# Patient Record
Sex: Female | Born: 1950
Health system: Southern US, Community
[De-identification: ages and names within clinical notes are randomized; demographics above are authoritative.]

## PROBLEM LIST (undated history)

## (undated) DIAGNOSIS — E785 Hyperlipidemia, unspecified: Secondary | ICD-10-CM

## (undated) DIAGNOSIS — R05 Cough: Secondary | ICD-10-CM

## (undated) DIAGNOSIS — K219 Gastro-esophageal reflux disease without esophagitis: Secondary | ICD-10-CM

## (undated) DIAGNOSIS — F329 Major depressive disorder, single episode, unspecified: Secondary | ICD-10-CM

## (undated) DIAGNOSIS — R059 Cough, unspecified: Secondary | ICD-10-CM

## (undated) DIAGNOSIS — F32A Depression, unspecified: Secondary | ICD-10-CM

## (undated) DIAGNOSIS — N289 Disorder of kidney and ureter, unspecified: Secondary | ICD-10-CM

## (undated) DIAGNOSIS — Z460 Encounter for fitting and adjustment of spectacles and contact lenses: Secondary | ICD-10-CM

## (undated) DIAGNOSIS — I10 Essential (primary) hypertension: Secondary | ICD-10-CM

## (undated) DIAGNOSIS — Z8489 Family history of other specified conditions: Secondary | ICD-10-CM

## (undated) DIAGNOSIS — M75101 Unspecified rotator cuff tear or rupture of right shoulder, not specified as traumatic: Secondary | ICD-10-CM

## (undated) DIAGNOSIS — M1990 Unspecified osteoarthritis, unspecified site: Secondary | ICD-10-CM

## (undated) HISTORY — DX: Hyperlipidemia, unspecified: E78.5

## (undated) HISTORY — PX: COLONOSCOPY: SHX174

## (undated) HISTORY — DX: Essential (primary) hypertension: I10

## (undated) HISTORY — PX: TONSILLECTOMY: SUR1361

## (undated) HISTORY — DX: Gastro-esophageal reflux disease without esophagitis: K21.9

## (undated) HISTORY — DX: Unspecified osteoarthritis, unspecified site: M19.90

## (undated) HISTORY — PX: ELBOW ARTHROSCOPY: SUR87

---

## 1985-12-22 HISTORY — PX: CARPAL TUNNEL RELEASE: SHX101

## 1987-12-23 HISTORY — PX: TUBAL LIGATION: SHX77

## 1995-12-23 HISTORY — PX: ROTATOR CUFF REPAIR: SHX139

## 1996-12-22 HISTORY — PX: BACK SURGERY: SHX140

## 1999-05-28 ENCOUNTER — Other Ambulatory Visit: Admission: RE | Admit: 1999-05-28 | Discharge: 1999-05-28 | Payer: Self-pay | Admitting: Obstetrics and Gynecology

## 2000-06-28 ENCOUNTER — Emergency Department (HOSPITAL_COMMUNITY): Admission: EM | Admit: 2000-06-28 | Discharge: 2000-06-28 | Payer: Self-pay

## 2000-07-07 ENCOUNTER — Encounter: Payer: Self-pay | Admitting: Neurosurgery

## 2000-07-07 ENCOUNTER — Encounter: Admission: RE | Admit: 2000-07-07 | Discharge: 2000-07-07 | Payer: Self-pay | Admitting: Neurosurgery

## 2000-11-05 ENCOUNTER — Other Ambulatory Visit: Admission: RE | Admit: 2000-11-05 | Discharge: 2000-11-05 | Payer: Self-pay | Admitting: Obstetrics and Gynecology

## 2000-11-19 ENCOUNTER — Encounter: Admission: RE | Admit: 2000-11-19 | Discharge: 2000-11-19 | Payer: Self-pay | Admitting: Neurology

## 2000-11-19 ENCOUNTER — Encounter: Payer: Self-pay | Admitting: Neurology

## 2000-12-28 ENCOUNTER — Other Ambulatory Visit: Admission: RE | Admit: 2000-12-28 | Discharge: 2000-12-28 | Payer: Self-pay | Admitting: Obstetrics and Gynecology

## 2000-12-28 ENCOUNTER — Encounter (INDEPENDENT_AMBULATORY_CARE_PROVIDER_SITE_OTHER): Payer: Self-pay

## 2001-01-22 ENCOUNTER — Other Ambulatory Visit: Admission: RE | Admit: 2001-01-22 | Discharge: 2001-01-22 | Payer: Self-pay | Admitting: Obstetrics and Gynecology

## 2001-01-22 ENCOUNTER — Encounter (INDEPENDENT_AMBULATORY_CARE_PROVIDER_SITE_OTHER): Payer: Self-pay | Admitting: Specialist

## 2001-11-22 ENCOUNTER — Other Ambulatory Visit: Admission: RE | Admit: 2001-11-22 | Discharge: 2001-11-22 | Payer: Self-pay | Admitting: Obstetrics and Gynecology

## 2003-03-22 ENCOUNTER — Other Ambulatory Visit: Admission: RE | Admit: 2003-03-22 | Discharge: 2003-03-22 | Payer: Self-pay | Admitting: Obstetrics and Gynecology

## 2003-04-26 ENCOUNTER — Encounter: Admission: RE | Admit: 2003-04-26 | Discharge: 2003-07-25 | Payer: Self-pay | Admitting: Obstetrics and Gynecology

## 2003-04-27 ENCOUNTER — Ambulatory Visit (HOSPITAL_COMMUNITY): Admission: RE | Admit: 2003-04-27 | Discharge: 2003-04-27 | Payer: Self-pay | Admitting: Gastroenterology

## 2003-12-23 HISTORY — PX: FOOT SURGERY: SHX648

## 2004-02-21 ENCOUNTER — Ambulatory Visit (HOSPITAL_COMMUNITY): Admission: RE | Admit: 2004-02-21 | Discharge: 2004-02-21 | Payer: Self-pay | Admitting: Family Medicine

## 2004-03-26 ENCOUNTER — Other Ambulatory Visit: Admission: RE | Admit: 2004-03-26 | Discharge: 2004-03-26 | Payer: Self-pay | Admitting: Obstetrics and Gynecology

## 2004-04-22 ENCOUNTER — Emergency Department (HOSPITAL_COMMUNITY): Admission: EM | Admit: 2004-04-22 | Discharge: 2004-04-22 | Payer: Self-pay | Admitting: Emergency Medicine

## 2004-12-11 ENCOUNTER — Ambulatory Visit: Payer: Self-pay | Admitting: Pain Medicine

## 2004-12-22 HISTORY — PX: DILATION AND CURETTAGE OF UTERUS: SHX78

## 2004-12-26 ENCOUNTER — Ambulatory Visit: Payer: Self-pay | Admitting: Pain Medicine

## 2005-01-23 ENCOUNTER — Ambulatory Visit: Payer: Self-pay | Admitting: Physician Assistant

## 2005-02-19 ENCOUNTER — Ambulatory Visit: Payer: Self-pay | Admitting: Physician Assistant

## 2005-05-20 ENCOUNTER — Other Ambulatory Visit: Admission: RE | Admit: 2005-05-20 | Discharge: 2005-05-20 | Payer: Self-pay | Admitting: Obstetrics and Gynecology

## 2005-10-13 ENCOUNTER — Ambulatory Visit: Payer: Self-pay | Admitting: Pain Medicine

## 2005-10-28 ENCOUNTER — Ambulatory Visit: Payer: Self-pay | Admitting: Pain Medicine

## 2005-10-31 ENCOUNTER — Encounter (INDEPENDENT_AMBULATORY_CARE_PROVIDER_SITE_OTHER): Payer: Self-pay | Admitting: Specialist

## 2005-10-31 ENCOUNTER — Ambulatory Visit (HOSPITAL_COMMUNITY): Admission: RE | Admit: 2005-10-31 | Discharge: 2005-10-31 | Payer: Self-pay | Admitting: Obstetrics and Gynecology

## 2005-12-01 ENCOUNTER — Ambulatory Visit: Payer: Self-pay | Admitting: Pain Medicine

## 2006-10-28 ENCOUNTER — Ambulatory Visit: Payer: Self-pay | Admitting: Pain Medicine

## 2006-12-22 HISTORY — PX: BACK SURGERY: SHX140

## 2010-03-29 ENCOUNTER — Encounter: Payer: Self-pay | Admitting: Cardiovascular Disease

## 2010-04-15 ENCOUNTER — Ambulatory Visit (HOSPITAL_COMMUNITY): Admission: RE | Admit: 2010-04-15 | Discharge: 2010-04-15 | Payer: Self-pay | Admitting: Obstetrics and Gynecology

## 2011-01-11 ENCOUNTER — Encounter: Payer: Self-pay | Admitting: Family Medicine

## 2011-03-11 LAB — BASIC METABOLIC PANEL
BUN: 24 mg/dL — ABNORMAL HIGH (ref 6–23)
CO2: 30 mEq/L (ref 19–32)
Calcium: 9.5 mg/dL (ref 8.4–10.5)
Chloride: 102 mEq/L (ref 96–112)
Creatinine, Ser: 1.39 mg/dL — ABNORMAL HIGH (ref 0.4–1.2)
GFR calc Af Amer: 47 mL/min — ABNORMAL LOW (ref >60)
GFR calc non Af Amer: 39 mL/min — ABNORMAL LOW (ref >60)
Glucose, Bld: 111 mg/dL — ABNORMAL HIGH (ref 70–99)
Potassium: 3.9 mEq/L (ref 3.5–5.1)
Sodium: 138 mEq/L (ref 135–145)

## 2011-03-11 LAB — CBC
HCT: 39.5 % (ref 36.0–46.0)
Hemoglobin: 13.6 g/dL (ref 12.0–15.0)
MCHC: 34.5 g/dL (ref 30.0–36.0)
Platelets: 220 10*3/uL (ref 150–400)
RDW: 12.1 % (ref 11.5–15.5)

## 2011-05-09 NOTE — Op Note (Signed)
NAME:  Briana Wolf, Briana Wolf              ACCOUNT NO.:  [id number]   MEDICAL RECORD NO.:  [id number]          PATIENT TYPE:  AMB   LOCATION:  SDC                           FACILITY:  [facility]   PHYSICIAN:  Juluis Mire, M.D.   DATE OF BIRTH:  1951-01-30   DATE OF PROCEDURE:  10/31/2005  DATE OF DISCHARGE:                                 OPERATIVE REPORT   PREOPERATIVE DIAGNOSIS:  Postmenopausal bleeding.   POSTOPERATIVE DIAGNOSIS:  Postmenopausal bleeding.   OPERATION/PROCEDURE:  1.  Cervical dilatation.  2.  Hysteroscopy.  3.  Endometrial biopsies.  4.  Endometrial curettings.   SURGEON:  Juluis Mire, M.D.   ANESTHESIA:  General.   ESTIMATED BLOOD LOSS:  Minimal.   PACKS AND DRAINS:  None.   INTRAOPERATIVE BLOOD REPLACED:  None.   COMPLICATIONS:  None.   INDICATIONS:  Dictated in history and physical.   DESCRIPTION OF PROCEDURE:  The patient was taken to the OR, placed in the  supine position.  After adequate level of general anesthesia obtained, the  patient was placed in the dorsal lithotomy position using the Allen  stirrups.  The perineum and vagina were prepped out with Betadine and draped  in the sterile field.  Speculum was placed in the vaginal vault and cervix  grasped with single-tooth tenaculum.  Paracervical block was instituted  using 1% Nesacaine.  Uterus sounded to 10 cm.  Cervix serially dilated to a  size 35 Pratt dilator.  Operative hysteroscope was introduced.  Endometrium  was thin and atrophic.  Multiple biopsies were obtained along with  curettings.  No signs  of perforation.  Total deficit was 80 mL.  Single-tooth tenaculum and  speculum were then removed.  The patient was taken out of the dorsal  lithotomy position and once alert was transferred to the recovery room in  good condition.  Sponge, instrument, and needle counts reported as correct  by the circulating nurse x2.      Juluis Mire, M.D.  Electronically Signed     JSM/MEDQ  D:   10/31/2005  T:  10/31/2005  Job:  [id number]

## 2011-05-09 NOTE — H&P (Signed)
NAME:  Briana Wolf, Briana Wolf              ACCOUNT NO.:  [id number]   MEDICAL RECORD NO.:  [id number]          PATIENT TYPE:  AMB   LOCATION:  SDC                           [facility]ILITY:  [facility]   PHYSICIAN:  Juluis Mire, M.D.   DATE OF BIRTH:  12/11/1951   DATE OF ADMISSION:  10/31/2005  DATE OF DISCHARGE:                                HISTORY & PHYSICAL   HISTORY OF PRESENT ILLNESS:  The patient is a 60 year old gravida 5, para 2,  abortus 3, postmenopausal female who presents for a hysteroscopy.   The patient has had problems with postmenopausal bleeding.  Had a previous  saline infusion ultrasound and endometrial sampling in March of this year  with negative findings.  Has had continued bleeding that has been relatively  heavy.  After discussions of options, we decided to proceed with repeat  hysteroscopy.   It is of note that the patient has had a previous hysteroscopy in 1992 and  1995, basically with negative findings.   ALLERGIES:  In terms of allergies, she is allergic CODEINE.   MEDICATIONS:  1.  Nexium.  2.  Wellbutrin.  3.  Skelaxin.  4.  A fluid pill.   PAST MEDICAL HISTORY:  Usual childhood diseases.  No significant sequelae.   PAST SURGICAL HISTORY:  1.  She has had a previous history of carpal tunnel that was released.  2.  She has had a tonsillectomy.  3.  Bilateral tubal ligation.  4.  Laparoscopy and hysteroscopy in 1992.  5.  Hysteroscopy in 1995.   FAMILY HISTORY:  Noncontributory.   SOCIAL HISTORY:  No tobacco or alcohol use.   REVIEW OF SYSTEMS:  Noncontributory.   PHYSICAL EXAMINATION:  VITAL SIGNS:  The patient is afebrile with stable  vital signs.  HEENT:  The patient is normocephalic.  Pupils equal, round and reactive to  light and accommodation.  Extraocular movements were intact.  Sclerae and  conjunctivae clear.  Oropharynx clear.  NECK:  Without thyromegaly.  BREASTS:  No discrete masses.  LUNGS:  Clear.  CARDIOVASCULAR:  Regular rate and  rhythm without murmurs or gallops.  ABDOMEN:  Benign.  No masses, organomegaly, or tenderness.  PELVIC:  Normal external genitalia.  Vaginal mucosa clear.  Cervix  unremarkable.  Uterus normal size, shape, and contour.  Adnexa free of  masses or tenderness.  EXTREMITIES:  Trace edema.  NEUROLOGIC:  Grossly within normal limits.   IMPRESSION:  Postmenopausal bleeding without endometrial pathology.   PLAN:  The patient will undergo a repeat hysteroscopy.  Risks of surgery  have been discussed including the risks of infection, the risks of vascular  injury that could lead to hemorrhage requiring transfusion, or possible  hysterectomy.  Risks of perforation that could lead to injury to adjacent  organs requiring exploratory surgery, risks of deep vein thrombosis and  pulmonary embolus.  The patient expressed understanding of the indications  and risks.      Juluis Mire, M.D.  Electronically Signed     JSM/MEDQ  D:  10/31/2005  T:  10/31/2005  Job:  [id number]

## 2011-05-09 NOTE — Op Note (Signed)
   NAME:  Briana Wolf, Briana Wolf                          ACCOUNT NO.:  [id number]   MEDICAL RECORD NO.:  [id number]                   PATIENT TYPE:  AMB   LOCATION:  ENDO                                 FACILITY:  [facility]   PHYSICIAN:  Danise Edge, M.D.                DATE OF BIRTH:  03-25-1951   DATE OF PROCEDURE:  DATE OF DISCHARGE:                                 OPERATIVE REPORT   PROCEDURE:  Screening colonoscopy.   INDICATIONS:  The patient is a 60 year old female born October 19, 2051.  The patient  is scheduled to undergo her first screening colonoscopy with polypectomy to  prevent colon cancer.   ENDOSCOPIST:  Danise Edge, M.D.   PREMEDICATION:  Versed 7.5 mg, Demerol 50 mg.   DESCRIPTION OF PROCEDURE:  After obtaining informed consent, the patient was  placed in the left lateral decubitus position.  I administered intravenous  Demerol and intravenous Versed to achieve conscious sedation for the  procedure.  The patient's blood pressure, oxygen saturation, and cardiac  rhythm were monitored throughout the procedure and documented in the medical  record.   Anal inspection was normal.  Digital rectal exam was normal.  The Olympus  pediatric video colonoscope was introduced into the rectum and easily  advanced to the cecum.  Colonic preparation for the exam today was  excellent.   Rectum normal.   Sigmoid colon and descending colon normal.   Splenic flexure normal.   Transverse colon normal.   Hepatic flexure normal.   Ascending colon normal.   Cecum and ileocecal valve normal.    ASSESSMENT:  Normal screening proctocolonoscopy to the cecum.  No endoscopic  evidence for the presence of colorectal neoplasia.                                               Danise Edge, M.D.    MJ/MEDQ  D:  04/27/2003  T:  04/27/2003  Job:  [id number]   cc:   Caryn Bee L. Little, M.D.  [address]  [place]  [place] [address]  Fax: [phone]   Juluis Mire, M.D.  [address] [place]  [place] [address]  Fax: [phone]

## 2011-09-03 ENCOUNTER — Ambulatory Visit (INDEPENDENT_AMBULATORY_CARE_PROVIDER_SITE_OTHER): Payer: 59 | Admitting: General Surgery

## 2011-09-10 ENCOUNTER — Encounter (INDEPENDENT_AMBULATORY_CARE_PROVIDER_SITE_OTHER): Payer: Self-pay | Admitting: General Surgery

## 2011-09-10 ENCOUNTER — Ambulatory Visit (INDEPENDENT_AMBULATORY_CARE_PROVIDER_SITE_OTHER): Payer: BC Managed Care – PPO | Admitting: General Surgery

## 2011-09-10 VITALS — BP 134/78 | HR 72 | Temp 96.4°F | Resp 14 | Ht 62.0 in | Wt 155.2 lb

## 2011-09-10 DIAGNOSIS — M6749 Ganglion, multiple sites: Secondary | ICD-10-CM

## 2011-09-10 DIAGNOSIS — M7130 Other bursal cyst, unspecified site: Secondary | ICD-10-CM

## 2011-09-10 NOTE — Progress Notes (Signed)
Subjective:     Patient ID: Briana Wolf, female   DOB: Jun 28, 1951, 60 y.o.   MRN: [id number]  HPIMrs. Acres is a 60 year old Caucasian female who was referred to Dr. Caryn Bee for possibly a lipoma in the left lower back. The patient says that she has a little area that can intermittently be seen these pointed to the right side Dr. Clarene Duke is not it says left on examination I find no evidence of obvious subcutaneous mass in this area at this time I would question whether this could be a little bursal she's had a previous back surgery and has a midline incision slightly lower than the area of question. There is also a small insect bites she says the [place] that occurred a week or 2 ago but she's not able to feel a mass at this time  Dr. Fredirick Maudlin note dated August 30 at a small area he did not put a measurement possibly 4-5 cm but there is obviously nothing of that size on exam today and if she is having problems with her back she says she does continue to have a disc  pain in her back, her muscle would have spasm you might  be able to feel something  at that time.   Review of Systems Past Surgical History  Procedure Date  . Back surgery 2008    cervical fusion   . Back surgery   . Rotator cuff repair 1997    let shoulder   . Carpal tunnel release 1987    left and right wrist   . Foot surgery 2005    bilateral    Current Outpatient Prescriptions  Medication Sig Dispense Refill  . Atorvastatin Calcium (LIPITOR PO) Take by mouth daily.        . BuPROPion HCl (WELLBUTRIN PO) Take 450 mg by mouth daily.        Marland Kitchen Dexlansoprazole (DEXILANT PO) Take by mouth daily.        . fish oil-omega-3 fatty acids 1000 MG capsule Take 2 g by mouth daily.        . MELOXICAM PO Take 10 mg by mouth daily.         Allergies  Allergen Reactions  . Codeine     Makes pt nauseous    No history of lipomas that she is aware of     Objective:   Physical ExamBP 134/78  Pulse 72  Temp 96.4 F (35.8 C)   Resp 14  Ht 5\' 2"  (1.575 m)  Wt 155 lb 4 oz (70.421 kg)  BMI 28.40 kg/m2The patient was examined predominantly to her back and I could feel nothing with her lying flat did get her up r flex and twist in it and she is pointing to the right side per Dr. Lissa Hoard noted definitely describes a left. The patient is not able to appreciate anything on either side today and I cannot feel anything either  What they're describe and is probably a lipoma I think it's much more likely to be even a small bursal of the hands of the lumbar vertebrae possibly have been back spasms since she still has a sort lumbar disc intermittently gives problems with pain      Assessment:       I gave her the little hand data lipomas and if she is able to put an abnormality on happy to see her again urine she's not having much pain and with more to be positive there is soft and  is definitely given discomfort or any recommendation or possible excision is considered Plan:     See prn

## 2011-09-10 NOTE — Patient Instructions (Signed)
Happy to see her again if there is any mass noted the please have her husband outlined the area of question since she cannot see the area easily

## 2012-05-04 ENCOUNTER — Other Ambulatory Visit: Payer: Self-pay | Admitting: Family Medicine

## 2012-05-04 DIAGNOSIS — R799 Abnormal finding of blood chemistry, unspecified: Secondary | ICD-10-CM

## 2012-05-05 ENCOUNTER — Ambulatory Visit
Admission: RE | Admit: 2012-05-05 | Discharge: 2012-05-05 | Disposition: A | Discharge status: Home / Self Care | Payer: 59 | Source: Ambulatory Visit | Attending: Family Medicine | Admitting: Family Medicine

## 2012-05-05 DIAGNOSIS — R799 Abnormal finding of blood chemistry, unspecified: Secondary | ICD-10-CM

## 2012-07-14 ENCOUNTER — Ambulatory Visit (INDEPENDENT_AMBULATORY_CARE_PROVIDER_SITE_OTHER): Payer: 59 | Admitting: Cardiovascular Disease

## 2012-07-14 ENCOUNTER — Encounter: Payer: Self-pay | Admitting: Cardiovascular Disease

## 2012-07-14 VITALS — BP 115/85 | HR 76 | Ht 61.0 in | Wt 147.8 lb

## 2012-07-14 DIAGNOSIS — R0600 Dyspnea, unspecified: Secondary | ICD-10-CM | POA: Insufficient documentation

## 2012-07-14 DIAGNOSIS — R0989 Other specified symptoms and signs involving the circulatory and respiratory systems: Secondary | ICD-10-CM

## 2012-07-14 DIAGNOSIS — E785 Hyperlipidemia, unspecified: Secondary | ICD-10-CM

## 2012-07-14 NOTE — Assessment & Plan Note (Signed)
Briana Wolf presents for various problems including shortness breath with exertion and some palpitations but she's never had any episodes of chest pain. She has a very strong family history of cardiac disease and is very worried that she may have some problems.  She does not get any regular aerobic exercise. She just notices the shortness breath when she does her daily activities.  We'll schedule her for a stress echocardiogram.  Hepatic profile, and basic metabolic profile.

## 2012-07-14 NOTE — Patient Instructions (Addendum)
Your physician recommends that you schedule a follow-up appointment in: 3 months   Your physician has requested that you have a stress echocardiogram. Please follow instruction sheet as given.  Your physician recommends that you return for a FASTING lipid profile: 3 months

## 2012-07-14 NOTE — Progress Notes (Signed)
    Datra Clary [id number] Date of Birth  July 20, 1951       [facility] Office [address]  [address] [place], [place]  [address]   [place], [place]  [address] [phone]     [phone]   Fax  [phone]    Fax [phone]  Problem List: 1. Chest pain 2. Hyperlipidemia 3. CKD - stage 3  History of Present Illness:  Millicent is a 61 yo with a strong family history of CAD (father with MI, mother has a pacer, sister has coronary athersclerosis).  She has occasional CP ( upper chest in the throat area).   She has some dyspnea with exercise which is her main concern today.  She walks her dog  on occasion.  Current Outpatient Prescriptions on File Prior to Visit  Medication Sig Dispense Refill  . Atorvastatin Calcium (LIPITOR PO) Take by mouth daily.        . BuPROPion HCl (WELLBUTRIN PO) Take 450 mg by mouth daily.        Marland Kitchen Dexlansoprazole (DEXILANT PO) Take by mouth daily.        . fish oil-omega-3 fatty acids 1000 MG capsule Take 2 g by mouth daily.        . phentermine 15 MG capsule Take 15 mg by mouth every morning.        Allergies  Allergen Reactions  . Codeine     Makes pt nauseous     Past Medical History  Diagnosis Date  . Hyperlipidemia   . Hypertension   . GERD (gastroesophageal reflux disease)   . Arthritis     Past Surgical History  Procedure Date  . Back surgery 2008    cervical fusion   . Back surgery   . Rotator cuff repair 1997    let shoulder   . Carpal tunnel release 1987    left and right wrist   . Foot surgery 2005    bilateral     History  Smoking status  . Never Smoker   Smokeless tobacco  . Not on file    History  Alcohol Use  . Yes    Family History  Problem Relation Age of Onset  . Diabetes Mother   . Heart disease Father     Reviw of Systems:  Reviewed in the HPI.  All other systems are negative.  Physical Exam: Blood pressure 115/85, pulse 76, height 5\' 1"  (1.549 m),  weight 147 lb 12.8 oz (67.042 kg). General: Well developed, well nourished, in no acute distress.  Head: Normocephalic, atraumatic, sclera non-icteric, mucus membranes are moist,   Neck: Supple. Carotids are 2 + without bruits. No JVD  Lungs: Clear bilaterally to auscultation.  Heart: regular rate.  normal  S1 S2. No murmurs, gallops or rubs.  Abdomen: Soft, non-tender, non-distended with normal bowel sounds. No hepatomegaly. No rebound/guarding. No masses.  Msk:  Strength and tone are normal  Extremities: No clubbing or cyanosis. No edema.  Distal pedal pulses are 2+ and equal bilaterally.  Neuro: Alert and oriented X 3. Moves all extremities spontaneously.  Psych:  Responds to questions appropriately with a normal affect.  ECG: 07/14/2012 NSR at 76 bpm. No ST or T wave changes  Assessment / Plan:

## 2012-07-21 ENCOUNTER — Ambulatory Visit (HOSPITAL_COMMUNITY): Payer: 59 | Attending: Cardiovascular Disease

## 2012-07-21 ENCOUNTER — Encounter: Payer: Self-pay | Admitting: Cardiovascular Disease

## 2012-07-21 DIAGNOSIS — R0600 Dyspnea, unspecified: Secondary | ICD-10-CM

## 2012-07-21 DIAGNOSIS — R0989 Other specified symptoms and signs involving the circulatory and respiratory systems: Secondary | ICD-10-CM | POA: Insufficient documentation

## 2012-07-21 DIAGNOSIS — R072 Precordial pain: Secondary | ICD-10-CM

## 2012-07-21 DIAGNOSIS — R0609 Other forms of dyspnea: Secondary | ICD-10-CM | POA: Insufficient documentation

## 2012-07-21 NOTE — Progress Notes (Signed)
Echocardiogram performed.  

## 2012-09-28 ENCOUNTER — Other Ambulatory Visit (INDEPENDENT_AMBULATORY_CARE_PROVIDER_SITE_OTHER): Payer: 59

## 2012-09-28 DIAGNOSIS — R0600 Dyspnea, unspecified: Secondary | ICD-10-CM

## 2012-09-28 DIAGNOSIS — R0609 Other forms of dyspnea: Secondary | ICD-10-CM

## 2012-09-28 DIAGNOSIS — E785 Hyperlipidemia, unspecified: Secondary | ICD-10-CM

## 2012-09-28 LAB — BASIC METABOLIC PANEL
BUN: 25 mg/dL — ABNORMAL HIGH (ref 6–23)
Calcium: 9.2 mg/dL (ref 8.4–10.5)
GFR: 48.76 mL/min — ABNORMAL LOW (ref >60.00)
Glucose, Bld: 90 mg/dL (ref 70–99)

## 2012-09-28 LAB — LIPID PANEL
HDL: 51.8 mg/dL (ref >39.00)
Triglycerides: 113 mg/dL (ref 0.0–149.0)
VLDL: 22.6 mg/dL (ref 0.0–40.0)

## 2012-09-28 LAB — HEPATIC FUNCTION PANEL
Albumin: 3.8 g/dL (ref 3.5–5.2)
Bilirubin, Direct: 0.1 mg/dL (ref 0.0–0.3)
Total Protein: 7 g/dL (ref 6.0–8.3)

## 2012-09-28 LAB — LDL CHOLESTEROL, DIRECT: Direct LDL: 133.1 mg/dL

## 2012-09-29 ENCOUNTER — Encounter: Payer: Self-pay | Admitting: *Deleted

## 2012-10-05 ENCOUNTER — Ambulatory Visit: Payer: 59 | Admitting: Cardiovascular Disease

## 2012-10-05 ENCOUNTER — Ambulatory Visit (INDEPENDENT_AMBULATORY_CARE_PROVIDER_SITE_OTHER): Payer: 59 | Admitting: Cardiovascular Disease

## 2012-10-05 ENCOUNTER — Encounter: Payer: Self-pay | Admitting: Cardiovascular Disease

## 2012-10-05 VITALS — BP 125/75 | HR 74 | Ht 61.0 in | Wt 152.0 lb

## 2012-10-05 DIAGNOSIS — I10 Essential (primary) hypertension: Secondary | ICD-10-CM

## 2012-10-05 DIAGNOSIS — E785 Hyperlipidemia, unspecified: Secondary | ICD-10-CM

## 2012-10-05 NOTE — Assessment & Plan Note (Signed)
Briana Wolf's last LDL was 133. She was previously on atorvastatin but does not remember the dose.  I offered to start her atorvastatin on low-dose. She would rather follow up with her general medical Dr.  Diamantina Providence see her on an as-needed basis.

## 2012-10-05 NOTE — Patient Instructions (Addendum)
Your physician recommends that you schedule a follow-up appointment in: as needed basis, follow up with your primary care physician.   Restart your atorvastatin

## 2012-10-05 NOTE — Progress Notes (Signed)
Graclyn Lawther [id number] Date of Birth  [birth year]       [facility] Office [address]  [address] [place], [place]  [address]   [place], [place]  [address] [phone]     [phone]   Fax  [phone]    Fax [phone]  Problem List: 1. Chest pain 2. Hyperlipidemia 3. CKD - stage 3 4. Hypertension  History of Present Illness:  Cameran is a 61 yo with a strong family history of CAD (father with MI, mother has a pacer, sister has coronary athersclerosis).  She has occasional CP ( upper chest in the throat area).   She has some dyspnea with exercise which is her main concern today.  She walks her dog  on occasion.  She has not had any further episodes of dyspnea.  She was previously on atorvastatin but does not remember the dose. Atorvastatin was apparently discontinued when her liver enzymes were elevated.  Her most recent cholesterol level reveals an LDL of 133.  Current Outpatient Prescriptions on File Prior to Visit  Medication Sig Dispense Refill  . BuPROPion HCl (WELLBUTRIN PO) Take 450 mg by mouth daily.        Marland Kitchen Dexlansoprazole (DEXILANT PO) Take by mouth daily.        . fish oil-omega-3 fatty acids 1000 MG capsule Take 2 g by mouth daily.        . phentermine 15 MG capsule Take 15 mg by mouth every morning.      . triamterene-hydrochlorothiazide (MAXZIDE-25) 37.5-25 MG per tablet Take 1 tablet by mouth daily.       . Atorvastatin Calcium (LIPITOR PO) Take by mouth daily.          Allergies  Allergen Reactions  . Codeine     Makes pt nauseous     Past Medical History  Diagnosis Date  . Hyperlipidemia   . Hypertension   . GERD (gastroesophageal reflux disease)   . Arthritis     Past Surgical History  Procedure Date  . Back surgery 2008    cervical fusion   . Back surgery   . Rotator cuff repair 1997    let shoulder   . Carpal tunnel release 1987    left and right wrist   . Foot surgery 2005   bilateral     History  Smoking status  . Never Smoker   Smokeless tobacco  . Not on file    History  Alcohol Use  . Yes    Family History  Problem Relation Age of Onset  . Diabetes Mother   . Heart disease Father     Reviw of Systems:  Reviewed in the HPI.  All other systems are negative.  Physical Exam: Blood pressure 150/96, pulse 74, height 5\' 1"  (1.549 m), weight 152 lb (68.947 kg), SpO2 98.00%. General: Well developed, well nourished, in no acute distress.  Head: Normocephalic, atraumatic, sclera non-icteric, mucus membranes are moist,   Neck: Supple. Carotids are 2 + without bruits. No JVD  Lungs: Clear bilaterally to auscultation.  Heart: regular rate.  normal  S1 S2. No murmurs, gallops or rubs.  Abdomen: Soft, non-tender, non-distended with normal bowel sounds. No hepatomegaly. No rebound/guarding. No masses.  Msk:  Strength and tone are normal  Extremities: No clubbing or cyanosis. No edema.  Distal pedal pulses are 2+ and equal bilaterally.  Neuro: Alert and oriented X 3. Moves all extremities spontaneously.  Psych:  Responds to questions appropriately with a normal affect.  ECG: 10/05/2012 NSR at 76 bpm. No ST or T wave changes  Assessment / Plan:

## 2012-10-05 NOTE — Assessment & Plan Note (Signed)
Blood pressure was initially a little elevated but it returned to the normal range after several minutes. I've asked her to watch her salt intake. She will followup with her general medical doctor for further management of her hypertension.

## 2012-10-13 ENCOUNTER — Encounter: Payer: Self-pay | Admitting: Cardiovascular Disease

## 2012-12-07 ENCOUNTER — Emergency Department (HOSPITAL_COMMUNITY)
Admission: EM | Admit: 2012-12-07 | Discharge: 2012-12-08 | Disposition: A | Discharge status: Home / Self Care | Payer: 59 | Attending: Emergency Medicine | Admitting: Emergency Medicine

## 2012-12-07 ENCOUNTER — Encounter (HOSPITAL_COMMUNITY): Payer: Self-pay | Admitting: *Deleted

## 2012-12-07 DIAGNOSIS — R51 Headache: Secondary | ICD-10-CM

## 2012-12-07 DIAGNOSIS — Z87828 Personal history of other (healed) physical injury and trauma: Secondary | ICD-10-CM | POA: Insufficient documentation

## 2012-12-07 DIAGNOSIS — E785 Hyperlipidemia, unspecified: Secondary | ICD-10-CM | POA: Insufficient documentation

## 2012-12-07 DIAGNOSIS — Z79899 Other long term (current) drug therapy: Secondary | ICD-10-CM | POA: Insufficient documentation

## 2012-12-07 DIAGNOSIS — Z8739 Personal history of other diseases of the musculoskeletal system and connective tissue: Secondary | ICD-10-CM | POA: Insufficient documentation

## 2012-12-07 DIAGNOSIS — K219 Gastro-esophageal reflux disease without esophagitis: Secondary | ICD-10-CM | POA: Insufficient documentation

## 2012-12-07 DIAGNOSIS — I10 Essential (primary) hypertension: Secondary | ICD-10-CM

## 2012-12-07 MED ORDER — DIPHENHYDRAMINE HCL 50 MG/ML IJ SOLN
25.0000 mg | Freq: Once | INTRAMUSCULAR | Status: AC
  Administered 2012-12-08: via INTRAVENOUS
  Filled 2012-12-07: qty 1

## 2012-12-07 MED ORDER — LISINOPRIL 10 MG PO TABS
10.0000 mg | ORAL_TABLET | Freq: Once | ORAL | Status: AC
  Administered 2012-12-08: 10 mg via ORAL
  Filled 2012-12-07: qty 1

## 2012-12-07 MED ORDER — METOCLOPRAMIDE HCL 5 MG/ML IJ SOLN
10.0000 mg | Freq: Once | INTRAMUSCULAR | Status: AC
  Administered 2012-12-08: 10 mg via INTRAVENOUS
  Filled 2012-12-07: qty 2
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
## 2012-12-07 NOTE — ED Notes (Signed)
Pt c/o headache x 3 days; saw pcp today was told bp elevated; continue to monitor bp tonight and states was up to 190; states started having chest tightness en route to hospital; ekg done in triage

## 2012-12-08 LAB — POCT I-STAT, CHEM 8
Calcium, Ion: 1.22 mmol/L (ref 1.12–1.23)
Glucose, Bld: 98 mg/dL (ref 70–99)
HCT: 38 % (ref 36.0–46.0)
Hemoglobin: 12.9 g/dL (ref 12.0–15.0)
TCO2: 27 mmol/L (ref 0–100)

## 2012-12-08 MED ORDER — LISINOPRIL 10 MG PO TABS: 10.0000 mg | ORAL_TABLET | Freq: Every day | ORAL | Status: DC

## 2012-12-08 MED ORDER — METOCLOPRAMIDE HCL 10 MG PO TABS: 10.0000 mg | ORAL_TABLET | Freq: Two times a day (BID) | ORAL | Status: DC | PRN

## 2013-02-05 ENCOUNTER — Other Ambulatory Visit: Payer: Self-pay

## 2013-04-15 ENCOUNTER — Other Ambulatory Visit: Payer: Self-pay | Admitting: Orthopedic Surgery

## 2013-05-20 ENCOUNTER — Encounter (HOSPITAL_BASED_OUTPATIENT_CLINIC_OR_DEPARTMENT_OTHER): Admission: RE | Payer: Self-pay | Source: Ambulatory Visit

## 2013-05-20 ENCOUNTER — Ambulatory Visit (HOSPITAL_BASED_OUTPATIENT_CLINIC_OR_DEPARTMENT_OTHER): Admission: RE | Admit: 2013-05-20 | Payer: 59 | Source: Ambulatory Visit | Admitting: Orthopedic Surgery

## 2013-05-20 SURGERY — SHOULDER ARTHROSCOPY WITH ROTATOR CUFF REPAIR AND SUBACROMIAL DECOMPRESSION
Anesthesia: General | Site: Shoulder | Laterality: Right

## 2013-07-20 ENCOUNTER — Other Ambulatory Visit: Payer: Self-pay | Admitting: Orthopedic Surgery

## 2013-07-22 NOTE — Progress Notes (Signed)
Will be out of town-will call 8/11-faxing recent labs and ekg done dr Melburn Popper 12/13

## 2013-08-01 ENCOUNTER — Encounter (HOSPITAL_BASED_OUTPATIENT_CLINIC_OR_DEPARTMENT_OTHER): Payer: Self-pay | Admitting: *Deleted

## 2013-08-01 NOTE — Progress Notes (Signed)
Pt saw dr Melburn Popper 12/13 for cp-stress echo negative-ekg 12/13 Sees nephrology for renal insuff-called for labs 07/19/13 To bring all meds No resp problems

## 2013-08-05 ENCOUNTER — Ambulatory Visit (HOSPITAL_BASED_OUTPATIENT_CLINIC_OR_DEPARTMENT_OTHER): Payer: 59 | Admitting: Anesthesiology

## 2013-08-05 ENCOUNTER — Encounter (HOSPITAL_BASED_OUTPATIENT_CLINIC_OR_DEPARTMENT_OTHER): Payer: Self-pay | Admitting: Anesthesiology

## 2013-08-05 ENCOUNTER — Encounter (HOSPITAL_BASED_OUTPATIENT_CLINIC_OR_DEPARTMENT_OTHER)
Admission: RE | Disposition: A | Discharge status: Home / Self Care | Payer: Self-pay | Source: Ambulatory Visit | Attending: Orthopedic Surgery

## 2013-08-05 ENCOUNTER — Ambulatory Visit (HOSPITAL_BASED_OUTPATIENT_CLINIC_OR_DEPARTMENT_OTHER)
Admission: RE | Admit: 2013-08-05 | Discharge: 2013-08-05 | Disposition: A | Discharge status: Home / Self Care | Payer: 59 | Source: Ambulatory Visit | Attending: Orthopedic Surgery | Admitting: Orthopedic Surgery

## 2013-08-05 DIAGNOSIS — M75101 Unspecified rotator cuff tear or rupture of right shoulder, not specified as traumatic: Secondary | ICD-10-CM | POA: Diagnosis present

## 2013-08-05 DIAGNOSIS — Y929 Unspecified place or not applicable: Secondary | ICD-10-CM | POA: Insufficient documentation

## 2013-08-05 DIAGNOSIS — S43429A Sprain of unspecified rotator cuff capsule, initial encounter: Secondary | ICD-10-CM | POA: Insufficient documentation

## 2013-08-05 DIAGNOSIS — I129 Hypertensive chronic kidney disease with stage 1 through stage 4 chronic kidney disease, or unspecified chronic kidney disease: Secondary | ICD-10-CM | POA: Insufficient documentation

## 2013-08-05 DIAGNOSIS — K219 Gastro-esophageal reflux disease without esophagitis: Secondary | ICD-10-CM | POA: Insufficient documentation

## 2013-08-05 DIAGNOSIS — N189 Chronic kidney disease, unspecified: Secondary | ICD-10-CM | POA: Insufficient documentation

## 2013-08-05 DIAGNOSIS — Z79899 Other long term (current) drug therapy: Secondary | ICD-10-CM | POA: Insufficient documentation

## 2013-08-05 DIAGNOSIS — X500XXA Overexertion from strenuous movement or load, initial encounter: Secondary | ICD-10-CM | POA: Insufficient documentation

## 2013-08-05 DIAGNOSIS — Z885 Allergy status to narcotic agent status: Secondary | ICD-10-CM | POA: Insufficient documentation

## 2013-08-05 DIAGNOSIS — M129 Arthropathy, unspecified: Secondary | ICD-10-CM | POA: Insufficient documentation

## 2013-08-05 DIAGNOSIS — E785 Hyperlipidemia, unspecified: Secondary | ICD-10-CM | POA: Insufficient documentation

## 2013-08-05 DIAGNOSIS — Y93K1 Activity, walking an animal: Secondary | ICD-10-CM | POA: Insufficient documentation

## 2013-08-05 HISTORY — PX: SHOULDER ARTHROSCOPY WITH ROTATOR CUFF REPAIR AND SUBACROMIAL DECOMPRESSION: SHX5686

## 2013-08-05 HISTORY — DX: Family history of other specified conditions: Z84.89

## 2013-08-05 HISTORY — DX: Unspecified rotator cuff tear or rupture of right shoulder, not specified as traumatic: M75.101

## 2013-08-05 HISTORY — DX: Encounter for fitting and adjustment of spectacles and contact lenses: Z46.0

## 2013-08-05 HISTORY — DX: Disorder of kidney and ureter, unspecified: N28.9

## 2013-08-05 SURGERY — SHOULDER ARTHROSCOPY WITH ROTATOR CUFF REPAIR AND SUBACROMIAL DECOMPRESSION
Anesthesia: General | Site: Shoulder | Laterality: Right | Wound class: Clean

## 2013-08-05 MED ORDER — BUPIVACAINE-EPINEPHRINE PF 0.5-1:200000 % IJ SOLN
INTRAMUSCULAR | Status: DC | PRN
  Administered 2013-08-05: 25 mL

## 2013-08-05 MED ORDER — DEXAMETHASONE SODIUM PHOSPHATE 4 MG/ML IJ SOLN
INTRAMUSCULAR | Status: DC | PRN
  Administered 2013-08-05: 10 mg via INTRAVENOUS
  Administered 2013-08-05: 4 mg

## 2013-08-05 MED ORDER — FENTANYL CITRATE 0.05 MG/ML IJ SOLN
50.0000 ug | INTRAMUSCULAR | Status: DC | PRN
  Administered 2013-08-05: 100 ug via INTRAVENOUS

## 2013-08-05 MED ORDER — LIDOCAINE HCL (CARDIAC) 20 MG/ML IV SOLN
INTRAVENOUS | Status: DC | PRN
  Administered 2013-08-05: 60 mg via INTRAVENOUS

## 2013-08-05 MED ORDER — PROMETHAZINE HCL 25 MG PO TABS: 25.0000 mg | ORAL_TABLET | Freq: Four times a day (QID) | ORAL | Status: DC | PRN

## 2013-08-05 MED ORDER — SENNA-DOCUSATE SODIUM 8.6-50 MG PO TABS: 2.0000 | ORAL_TABLET | Freq: Every day | ORAL | Status: DC

## 2013-08-05 MED ORDER — MIDAZOLAM HCL 2 MG/2ML IJ SOLN
1.0000 mg | INTRAMUSCULAR | Status: DC | PRN
  Administered 2013-08-05: 2 mg via INTRAVENOUS

## 2013-08-05 MED ORDER — ONDANSETRON HCL 4 MG/2ML IJ SOLN
INTRAMUSCULAR | Status: DC | PRN
  Administered 2013-08-05: 4 mg via INTRAVENOUS

## 2013-08-05 MED ORDER — CEFAZOLIN SODIUM-DEXTROSE 2-3 GM-% IV SOLR
2.0000 g | INTRAVENOUS | Status: AC
  Administered 2013-08-05: 2 g via INTRAVENOUS

## 2013-08-05 MED ORDER — FENTANYL CITRATE 0.05 MG/ML IJ SOLN
INTRAMUSCULAR | Status: DC | PRN
  Administered 2013-08-05: 25 ug via INTRAVENOUS

## 2013-08-05 MED ORDER — SODIUM CHLORIDE 0.9 % IR SOLN
Status: DC | PRN
  Administered 2013-08-05: 18000 mL

## 2013-08-05 MED ORDER — METHOCARBAMOL 500 MG PO TABS: 500.0000 mg | ORAL_TABLET | Freq: Four times a day (QID) | ORAL | Status: DC

## 2013-08-05 MED ORDER — OXYCODONE-ACETAMINOPHEN 10-325 MG PO TABS: 1.0000 | ORAL_TABLET | Freq: Four times a day (QID) | ORAL | Status: DC | PRN

## 2013-08-05 MED ORDER — PROPOFOL 10 MG/ML IV BOLUS
INTRAVENOUS | Status: DC | PRN
  Administered 2013-08-05: 200 mg via INTRAVENOUS

## 2013-08-05 MED ORDER — LACTATED RINGERS IV SOLN
INTRAVENOUS | Status: DC
  Administered 2013-08-05: 07:00:00 via INTRAVENOUS
  Administered 2013-08-05: 10 mL/h via INTRAVENOUS
  Administered 2013-08-05: 08:00:00 via INTRAVENOUS

## 2013-08-05 MED ORDER — SUCCINYLCHOLINE CHLORIDE 20 MG/ML IJ SOLN
INTRAMUSCULAR | Status: DC | PRN
  Administered 2013-08-05: 100 mg via INTRAVENOUS

## 2013-08-05 SURGICAL SUPPLY — 70 items
ANCH SUT SWLK 19.1X4.75 (Anchor) ×1 IMPLANT
ANCHOR SUT BIO SW 4.75X19.1 (Anchor) ×1 IMPLANT
APL SKNCLS STERI-STRIP NONHPOA (GAUZE/BANDAGES/DRESSINGS) ×1
BENZOIN TINCTURE PRP APPL 2/3 (GAUZE/BANDAGES/DRESSINGS) ×2 IMPLANT
BLADE CUTTER GATOR 3.5 (BLADE) ×2 IMPLANT
BLADE GREAT WHITE 4.2 (BLADE) IMPLANT
BLADE SURG 15 STRL LF DISP TIS (BLADE) IMPLANT
BLADE SURG 15 STRL SS (BLADE)
BUR OVAL 4.0 (BURR) IMPLANT
BUR OVAL 6.0 (BURR) ×1 IMPLANT
CANISTER OMNI JUG 16 LITER (MISCELLANEOUS) ×2 IMPLANT
CANNULA 5.75X71 LONG (CANNULA) ×2 IMPLANT
CANNULA TWIST IN 8.25X7CM (CANNULA) ×2 IMPLANT
CLOTH BEACON ORANGE TIMEOUT ST (SAFETY) ×2 IMPLANT
DECANTER SPIKE VIAL GLASS SM (MISCELLANEOUS) IMPLANT
DRAPE INCISE IOBAN 66X45 STRL (DRAPES) ×2 IMPLANT
DRAPE SHOULDER BEACH CHAIR (DRAPES) ×2 IMPLANT
DRAPE U 20/CS (DRAPES) ×2 IMPLANT
DRAPE U-SHAPE 47X51 STRL (DRAPES) ×2 IMPLANT
DRSG PAD ABDOMINAL 8X10 ST (GAUZE/BANDAGES/DRESSINGS) ×2 IMPLANT
DURAPREP 26ML APPLICATOR (WOUND CARE) ×2 IMPLANT
ELECT REM PT RETURN 9FT ADLT (ELECTROSURGICAL) ×2
ELECTRODE REM PT RTRN 9FT ADLT (ELECTROSURGICAL) ×1 IMPLANT
FIBERSTICK 2 (SUTURE) IMPLANT
GLOVE BIO SURGEON STRL SZ8 (GLOVE) ×2 IMPLANT
GLOVE BIOGEL PI IND STRL 7.0 (GLOVE) IMPLANT
GLOVE BIOGEL PI IND STRL 8 (GLOVE) ×2 IMPLANT
GLOVE BIOGEL PI INDICATOR 7.0 (GLOVE) ×1
GLOVE BIOGEL PI INDICATOR 8 (GLOVE) ×2
GLOVE ECLIPSE 6.5 STRL STRAW (GLOVE) ×1 IMPLANT
GLOVE EXAM NITRILE LRG STRL (GLOVE) ×1 IMPLANT
GLOVE ORTHO TXT STRL SZ7.5 (GLOVE) ×2 IMPLANT
GOWN BRE IMP PREV XXLGXLNG (GOWN DISPOSABLE) ×4 IMPLANT
GOWN PREVENTION PLUS XLARGE (GOWN DISPOSABLE) ×1 IMPLANT
IMMOBILIZER SHOULDER XLGE (ORTHOPEDIC SUPPLIES) IMPLANT
IV NS IRRIG 3000ML ARTHROMATIC (IV SOLUTION) ×5 IMPLANT
KIT SHOULDER TRACTION (DRAPES) ×2 IMPLANT
LASSO SUT 90 DEGREE (SUTURE) IMPLANT
NDL SCORPION MULTI FIRE (NEEDLE) IMPLANT
NEEDLE SCORPION MULTI FIRE (NEEDLE) ×2 IMPLANT
PACK ARTHROSCOPY DSU (CUSTOM PROCEDURE TRAY) ×2 IMPLANT
PACK BASIN DAY SURGERY FS (CUSTOM PROCEDURE TRAY) ×2 IMPLANT
SET ARTHROSCOPY TUBING (MISCELLANEOUS) ×2
SET ARTHROSCOPY TUBING LN (MISCELLANEOUS) ×1 IMPLANT
SHEET MEDIUM DRAPE 40X70 STRL (DRAPES) ×2 IMPLANT
SLEEVE SCD COMPRESS KNEE MED (MISCELLANEOUS) ×2 IMPLANT
SLING ARM FOAM STRAP LRG (SOFTGOODS) IMPLANT
SLING ARM FOAM STRAP MED (SOFTGOODS) IMPLANT
SLING ARM FOAM STRAP XLG (SOFTGOODS) IMPLANT
SLING ARM IMMOBILIZER LRG (SOFTGOODS) ×1 IMPLANT
SLING ARM IMMOBILIZER MED (SOFTGOODS) IMPLANT
SPONGE GAUZE 4X4 12PLY (GAUZE/BANDAGES/DRESSINGS) ×2 IMPLANT
STRIP CLOSURE SKIN 1/2X4 (GAUZE/BANDAGES/DRESSINGS) ×2 IMPLANT
SUT FIBERWIRE #2 38 T-5 BLUE (SUTURE) ×2
SUT LASSO 45 DEGREE (SUTURE) IMPLANT
SUT LASSO 45 DEGREE LEFT (SUTURE) IMPLANT
SUT LASSO 45D RIGHT (SUTURE) IMPLANT
SUT MNCRL AB 4-0 PS2 18 (SUTURE) IMPLANT
SUT PDS AB 1 CT  36 (SUTURE)
SUT PDS AB 1 CT 36 (SUTURE) IMPLANT
SUT TIGER TAPE 7 IN WHITE (SUTURE) ×1 IMPLANT
SUT VIC AB 3-0 SH 27 (SUTURE)
SUT VIC AB 3-0 SH 27X BRD (SUTURE) IMPLANT
SUTURE FIBERWR #2 38 T-5 BLUE (SUTURE) IMPLANT
TAPE FIBER 2MM 7IN #2 BLUE (SUTURE) ×1 IMPLANT
TOWEL OR 17X24 6PK STRL BLUE (TOWEL DISPOSABLE) ×2 IMPLANT
TOWEL OR NON WOVEN STRL DISP B (DISPOSABLE) ×2 IMPLANT
TUBE CONNECTING 20X1/4 (TUBING) ×1 IMPLANT
WAND STAR VAC 90 (SURGICAL WAND) ×2 IMPLANT
WATER STERILE IRR 1000ML POUR (IV SOLUTION) ×2 IMPLANT

## 2013-08-05 NOTE — Anesthesia Postprocedure Evaluation (Signed)
  Anesthesia Post-op Note  Patient: Briana Wolf  Procedure(s) Performed: Procedure(s): RIGHT SHOULDER ARTHROSCOPY WITH EXTENSIVE DEBRIDEMENT, ROTATOR CUFF REPAIR AND SUBACROMIAL DECOMPRESSION (Right)  Patient Location: PACU  Anesthesia Type:GA combined with regional for post-op pain  Level of Consciousness: awake, alert  and oriented  Airway and Oxygen Therapy: Patient Spontanous Breathing  Post-op Pain: none  Post-op Assessment: Post-op Vital signs reviewed  Post-op Vital Signs: Reviewed  Complications: No apparent anesthesia complications

## 2013-08-05 NOTE — Anesthesia Preprocedure Evaluation (Signed)
Anesthesia Evaluation  Patient identified by MRN, date of birth, ID band Patient awake    Reviewed: Allergy & Precautions, H&P , NPO status , Patient's Chart, lab work & pertinent test results  Airway Mallampati: I TM Distance: >3 FB Neck ROM: Full    Dental  (+) Teeth Intact and Dental Advisory Given   Pulmonary  breath sounds clear to auscultation        Cardiovascular hypertension, Pt. on medications Rhythm:Regular Rate:Normal     Neuro/Psych    GI/Hepatic   Endo/Other    Renal/GU      Musculoskeletal   Abdominal   Peds  Hematology   Anesthesia Other Findings   Reproductive/Obstetrics                           Anesthesia Physical Anesthesia Plan  ASA: II  Anesthesia Plan: General   Post-op Pain Management:    Induction: Intravenous  Airway Management Planned: Oral ETT  Additional Equipment:   Intra-op Plan:   Post-operative Plan: Extubation in OR  Informed Consent: I have reviewed the patients History and Physical, chart, labs and discussed the procedure including the risks, benefits and alternatives for the proposed anesthesia with the patient or authorized representative who has indicated his/her understanding and acceptance.   Dental advisory given  Plan Discussed with: Anesthesiologist, CRNA and Surgeon  Anesthesia Plan Comments:         Anesthesia Quick Evaluation

## 2013-08-05 NOTE — Progress Notes (Signed)
Assisted Dr. Crews with right, ultrasound guided, interscalene  block. Side rails up, monitors on throughout procedure. See vital signs in flow sheet. Tolerated Procedure well. 

## 2013-08-05 NOTE — Transfer of Care (Signed)
Immediate Anesthesia Transfer of Care Note  Patient: Briana Wolf  Procedure(s) Performed: Procedure(s): RIGHT SHOULDER ARTHROSCOPY WITH EXTENSIVE DEBRIDEMENT, ROTATOR CUFF REPAIR AND SUBACROMIAL DECOMPRESSION (Right)  Patient Location: PACU  Anesthesia Type:General  Level of Consciousness: awake  Airway & Oxygen Therapy: Patient Spontanous Breathing and Patient connected to face mask oxygen  Post-op Assessment: Report given to PACU RN and Post -op Vital signs reviewed and stable  Post vital signs: Reviewed and stable  Complications: No apparent anesthesia complications

## 2013-08-05 NOTE — Anesthesia Procedure Notes (Addendum)
Anesthesia Regional Block:  Interscalene brachial plexus block  Pre-Anesthetic Checklist: ,, timeout performed, Correct Patient, Correct Site, Correct Laterality, Correct Procedure, Correct Position, site marked, Risks and benefits discussed,  Surgical consent,  Pre-op evaluation,  At surgeon's request and post-op pain management  Laterality: Right and Upper  Prep: chloraprep       Needles:  Injection technique: Single-shot  Needle Type: Echogenic Needle     Needle Length: 5cm 5 cm Needle Gauge: 21    Additional Needles:  Procedures: ultrasound guided (picture in chart) Interscalene brachial plexus block Narrative:  Start time: 08/05/2013 8:10 AM End time: 08/05/2013 8:17 AM Injection made incrementally with aspirations every 5 mL.  Performed by: Personally  Anesthesiologist: Sheldon Silvan, MD  Interscalene brachial plexus block Procedure Name: Intubation Performed by: York Grice Pre-anesthesia Checklist: Patient identified, Timeout performed, Emergency Drugs available, Suction available and Patient being monitored Patient Re-evaluated:Patient Re-evaluated prior to inductionOxygen Delivery Method: Circle system utilized Preoxygenation: Pre-oxygenation with 100% oxygen Intubation Type: IV induction Ventilation: Mask ventilation without difficulty Laryngoscope Size: Miller and 2 Grade View: Grade II Tube type: Oral Tube size: 7.0 mm Number of attempts: 1 Airway Equipment and Method: Stylet Secured at: 23 cm Tube secured with: Tape Dental Injury: Teeth and Oropharynx as per pre-operative assessment

## 2013-08-05 NOTE — Op Note (Signed)
08/05/2013  10:29 AM  PATIENT:  Briana Wolf    PRE-OPERATIVE DIAGNOSIS:  RIGHT SHOULDER IMPINGEMENT SYNDROME DISORDERS OF BURSAE AND TENDONS IN SHOULDER REGION, COMPLETE RUPTURE OF ROTATOR CUFF  POST-OPERATIVE DIAGNOSIS:  Right shoulder full thickness supraspinatus tear, with infraspinatus cleavage tear, superior labral fraying, partial thickness subscapularis tear  PROCEDURE:  RIGHT SHOULDER ARTHROSCOPY WITH EXTENSIVE DEBRIDEMENT, ROTATOR CUFF REPAIR AND SUBACROMIAL DECOMPRESSION  SURGEON:  Eulas Post, MD  PHYSICIAN ASSISTANT: Janace Litten, OPA-C, present and scrubbed throughout the case, critical for completion in a timely fashion, and for retraction, instrumentation, and closure.  ANESTHESIA:   General  PREOPERATIVE INDICATIONS:  CALEDONIA ZOU is a  62 y.o. female with a diagnosis of RIGHT SHOULDER IMPINGEMENT SYNDROME DISORDERS OF BURSAE AND TENDONS IN SHOULDER REGION, COMPLETE RUPTURE OF ROTATOR CUFF who failed conservative measures and elected for surgical management.    The risks benefits and alternatives were discussed with the patient preoperatively including but not limited to the risks of infection, bleeding, nerve injury, cardiopulmonary complications, the need for revision surgery, among others, and the patient was willing to proceed.  OPERATIVE IMPLANTS: Arthrex bio composite 4.75 mm anchor x1 with an inverted fiber tape, inverted FiberWire, and a simple safety stitch #2 FiberWire to repair a slight dogear posteriorly.  OPERATIVE FINDINGS: There was a large retracted supraspinatus tear, that was about 80% of the tendon. There was still a superior layer of the tendon was intact, but the tendon quality was extremely poor. There was a cleavage portion between the infraspinatus and supraspinatus posteriorly. The biceps tendon had intact pulley, but there was some hourglass formation of the tendon itself. There was minimal fraying. The labrum had some fraying, and the  superior aspect of the subscapularis was also frayed. The tissue quality was mediocre to poor.  OPERATIVE PROCEDURE: The patient was brought to the operating room and placed in supine position. General anesthesia was administered. IV antibiotics were given. She also had a regional block. The right upper extremity was prepped and draped in usual sterile fashion. She was in a semilateral decubitus position, and all bony prominences were padded. 10 pounds of traction was utilized. Time out was performed.  Diagnostic arthroscopy was carried out with the above-named findings. The arthroscopic shaver was used to debride the superior labrum, the anterior labrum, as well as the upper border of the subscapularis back to a stable configuration. I also debrided the undersurface of the rotator cuff tear. The articular cartilage was all in good condition.  I then went to the subacromial space, and there was moderate subacromial spurring. The CA ligament was released and a light acromioplasty was performed with the bur. I performed a complete bursectomy, and exposed my care. I did have to take down the remaining 20% of intact tendon. I then performed a light tuberoplasty, taking care to preserve the cortical bone for future fixation. I had multiple cannulas, and evaluated the tear both from anterior and posterior.  I used the bird beak to pass 2 limbs of #2 FiberWire suture through the back, and then used a scorpion to pass an inverted horizontal mattress suture using fiber tape through the front. Care was taken to make sure that I did not in severe the biceps. During the first pass I actually did circle the biceps, and this was undone and then repassed.  Once I passed my sutures, I placed a anchor with all of the sutures, bringing the tendon back to the bone. I had satisfactory advancement of  the tendon, although the tissue quality was quite poor, especially anteriorly on the supraspinatus. The posterior tissue was  definitely better quality.  There was a slight dogear on the infraspinatus cleavage portion, which I used a bird beak to repair with reinforcement using a simple suture that was remaining in the anchor.  All the sutures were then cut, the acromioplasty was confirmed from the lateral view and I touched up with the bur from posteriorly.  The rotator cuff now moved as a single unit, and the arthroscopic instruments removed, the portals closed with Monocryl followed by Steri-Strips and sterile gauze. She was awakened and returned back in stable and satisfactory condition. There were no complications and she tolerated the procedure well.

## 2013-08-05 NOTE — H&P (Signed)
PREOPERATIVE H&P  Chief Complaint: RIGHT SHOULDER IMPINGEMENT SYNDROME DISORDERS OF BURSAE AND TENDONS IN SHOULDER REGION, COMPLETE RUPTURE OF ROTATOR CUFF  HPI: Briana Wolf is a 62 y.o. female who presents for preoperative history and physical with a diagnosis of RIGHT SHOULDER IMPINGEMENT SYNDROME DISORDERS OF BURSAE AND TENDONS IN SHOULDER REGION, COMPLETE RUPTURE OF ROTATOR CUFF. Symptoms are rated as moderate to severe, and have been worsening.  This is significantly impairing activities of daily living.  She has elected for surgical management. This happened after she was walking the dog, and the dog pulled away, and she felt a pop. She's had pain for at least a year, although the last 3 months have been severe.  Past Medical History  Diagnosis Date  . Hyperlipidemia   . Hypertension   . GERD (gastroesophageal reflux disease)   . Arthritis   . Family history of anesthesia complication     mom allergic-to many meds-multiple medical problems  . Renal insufficiency   . Contact lens/glasses fitting     wears contacts or glasses at times   Past Surgical History  Procedure Laterality Date  . Rotator cuff repair  1997    let shoulder   . Carpal tunnel release  1987    left and right wrist   . Foot surgery  2005    bilateral   . Back surgery  2008    cervical fusion   . Back surgery  1998    lumb disk  . Tonsillectomy    . Dilation and curettage of uterus  2006  . Tubal ligation    . Colonoscopy    . Elbow arthroscopy      ulnar nerve-rt   History   Social History  . Marital Status: Married    Spouse Name: N/A    Number of Children: N/A  . Years of Education: N/A   Social History Main Topics  . Smoking status: Never Smoker   . Smokeless tobacco: None  . Alcohol Use: Yes  . Drug Use: No  . Sexual Activity: None   Other Topics Concern  . None   Social History Narrative  . None   Family History  Problem Relation Age of Onset  . Diabetes Mother   . Heart  disease Father    Allergies  Allergen Reactions  . Codeine Nausea Only    Makes pt nauseous    Prior to Admission medications   Medication Sig Start Date End Date Taking? Authorizing Provider  buPROPion (WELLBUTRIN XL) 150 MG 24 hr tablet Take 450 mg by mouth every morning.   Yes Historical Provider, MD  Dexlansoprazole (DEXILANT) 30 MG capsule Take 30 mg by mouth every morning.   Yes Historical Provider, MD  omega-3 acid ethyl esters (LOVAZA) 1 G capsule Take 2 g by mouth every morning.   Yes Historical Provider, MD  traMADol (ULTRAM) 50 MG tablet Take 50 mg by mouth every 6 (six) hours as needed. For pain 09/03/12  Yes Historical Provider, MD  triamterene-hydrochlorothiazide (MAXZIDE-25) 37.5-25 MG per tablet Take 1 tablet by mouth every evening.  07/08/12  Yes Historical Provider, MD  Cyanocobalamin (VITAMIN B-12) 1000 MCG SUBL Place 1 tablet under the tongue every morning.    Historical Provider, MD  metoCLOPramide (REGLAN) 10 MG tablet Take 1 tablet (10 mg total) by mouth 2 (two) times daily as needed (headache / nausea). 12/08/12   Vida Roller, MD     Positive ROS: All other systems have been reviewed  and were otherwise negative with the exception of those mentioned in the HPI and as above.  Physical Exam: General: Alert, no acute distress Cardiovascular: No pedal edema Respiratory: No cyanosis, no use of accessory musculature GI: No organomegaly, abdomen is soft and non-tender Skin: No lesions in the area of chief complaint Neurologic: Sensation intact distally Psychiatric: Patient is competent for consent with normal mood and affect Lymphatic: No axillary or cervical lymphadenopathy  MUSCULOSKELETAL: Right shoulder active range of motion is 0-30. Abduction is to 80. She has a positive drop arm sign. No pain over the a.c. joint.  Assessment: RIGHT SHOULDER IMPINGEMENT SYNDROME DISORDERS OF BURSAE AND TENDONS IN SHOULDER REGION, COMPLETE RUPTURE OF ROTATOR  CUFF  Plan: Plan for Procedure(s): RIGHT SHOULDER ARTHROSCOPY WITH LIMITED DEBRIDEMENT, ROTATOR CUFF REPAIR AND SUBACROMIAL DECOMPRESSION  The risks benefits and alternatives were discussed with the patient including but not limited to the risks of nonoperative treatment, versus surgical intervention including infection, bleeding, nerve injury,  blood clots, cardiopulmonary complications, morbidity, mortality, among others, and they were willing to proceed. We have also discussed the risks for incomplete repair, recurrent tear, incomplete return of function, persistent symptoms.  Eulas Post, MD Cell [phone]   08/05/2013 8:24 AM

## 2013-08-09 ENCOUNTER — Encounter (HOSPITAL_BASED_OUTPATIENT_CLINIC_OR_DEPARTMENT_OTHER): Payer: Self-pay | Admitting: Orthopedic Surgery

## 2013-10-13 ENCOUNTER — Other Ambulatory Visit (HOSPITAL_COMMUNITY): Payer: Self-pay | Admitting: Orthopedic Surgery

## 2013-10-13 DIAGNOSIS — I82401 Acute embolism and thrombosis of unspecified deep veins of right lower extremity: Secondary | ICD-10-CM

## 2013-10-14 ENCOUNTER — Ambulatory Visit (HOSPITAL_COMMUNITY)
Admission: RE | Admit: 2013-10-14 | Discharge: 2013-10-14 | Disposition: A | Discharge status: Home / Self Care | Payer: 59 | Source: Ambulatory Visit | Attending: Cardiovascular Disease | Admitting: Cardiovascular Disease

## 2013-10-14 DIAGNOSIS — M7989 Other specified soft tissue disorders: Secondary | ICD-10-CM | POA: Insufficient documentation

## 2013-10-14 DIAGNOSIS — I82401 Acute embolism and thrombosis of unspecified deep veins of right lower extremity: Secondary | ICD-10-CM

## 2013-10-14 NOTE — Progress Notes (Signed)
Right Upper Ext. Venous Duplex Completed. Negative for DVT. Heloise Gordan, BS, RDMS, RVT  

## 2013-10-27 ENCOUNTER — Other Ambulatory Visit: Payer: Self-pay

## 2013-12-19 ENCOUNTER — Other Ambulatory Visit: Payer: Self-pay | Admitting: Orthopedic Surgery

## 2013-12-19 DIAGNOSIS — R52 Pain, unspecified: Secondary | ICD-10-CM

## 2013-12-30 ENCOUNTER — Ambulatory Visit
Admission: RE | Admit: 2013-12-30 | Discharge: 2013-12-30 | Disposition: A | Discharge status: Home / Self Care | Payer: 59 | Source: Ambulatory Visit | Attending: Orthopedic Surgery | Admitting: Orthopedic Surgery

## 2013-12-30 DIAGNOSIS — R52 Pain, unspecified: Secondary | ICD-10-CM

## 2013-12-30 MED ORDER — IOHEXOL 180 MG/ML  SOLN: 15.0000 mL | Freq: Once | INTRAMUSCULAR | Status: AC | PRN

## 2014-01-16 ENCOUNTER — Encounter (HOSPITAL_BASED_OUTPATIENT_CLINIC_OR_DEPARTMENT_OTHER): Payer: Self-pay | Admitting: *Deleted

## 2014-01-16 ENCOUNTER — Other Ambulatory Visit: Payer: Self-pay | Admitting: Orthopedic Surgery

## 2014-01-16 NOTE — Progress Notes (Signed)
To come in for ekg and possible bmet-will call dr little for recent labs Was here for this shoulder 8/14-tore rc again

## 2014-01-17 ENCOUNTER — Encounter (HOSPITAL_BASED_OUTPATIENT_CLINIC_OR_DEPARTMENT_OTHER)
Admission: RE | Admit: 2014-01-17 | Discharge: 2014-01-17 | Disposition: A | Discharge status: Home / Self Care | Payer: 59 | Source: Ambulatory Visit | Attending: Orthopedic Surgery | Admitting: Orthopedic Surgery

## 2014-01-17 LAB — BASIC METABOLIC PANEL
BUN: 25 mg/dL — AB (ref 6–23)
CHLORIDE: 99 meq/L (ref 96–112)
CO2: 29 mEq/L (ref 19–32)
Calcium: 9.4 mg/dL (ref 8.4–10.5)
Creatinine, Ser: 1.21 mg/dL — ABNORMAL HIGH (ref 0.50–1.10)
GFR calc non Af Amer: 47 mL/min — ABNORMAL LOW (ref >90)
GFR, EST AFRICAN AMERICAN: 55 mL/min — AB (ref >90)
GLUCOSE: 89 mg/dL (ref 70–99)
POTASSIUM: 4.3 meq/L (ref 3.7–5.3)
SODIUM: 140 meq/L (ref 137–147)

## 2014-01-20 ENCOUNTER — Encounter (HOSPITAL_BASED_OUTPATIENT_CLINIC_OR_DEPARTMENT_OTHER)
Admission: RE | Disposition: A | Discharge status: Home / Self Care | Payer: Self-pay | Source: Ambulatory Visit | Attending: Orthopedic Surgery

## 2014-01-20 ENCOUNTER — Encounter (HOSPITAL_BASED_OUTPATIENT_CLINIC_OR_DEPARTMENT_OTHER): Payer: 59 | Admitting: Anesthesiology

## 2014-01-20 ENCOUNTER — Ambulatory Visit (HOSPITAL_BASED_OUTPATIENT_CLINIC_OR_DEPARTMENT_OTHER)
Admission: RE | Admit: 2014-01-20 | Discharge: 2014-01-20 | Disposition: A | Discharge status: Home / Self Care | Payer: 59 | Source: Ambulatory Visit | Attending: Orthopedic Surgery | Admitting: Orthopedic Surgery

## 2014-01-20 ENCOUNTER — Encounter (HOSPITAL_BASED_OUTPATIENT_CLINIC_OR_DEPARTMENT_OTHER): Payer: Self-pay | Admitting: *Deleted

## 2014-01-20 ENCOUNTER — Ambulatory Visit (HOSPITAL_BASED_OUTPATIENT_CLINIC_OR_DEPARTMENT_OTHER): Payer: 59 | Admitting: Anesthesiology

## 2014-01-20 DIAGNOSIS — M129 Arthropathy, unspecified: Secondary | ICD-10-CM | POA: Insufficient documentation

## 2014-01-20 DIAGNOSIS — I10 Essential (primary) hypertension: Secondary | ICD-10-CM | POA: Insufficient documentation

## 2014-01-20 DIAGNOSIS — Z9851 Tubal ligation status: Secondary | ICD-10-CM | POA: Insufficient documentation

## 2014-01-20 DIAGNOSIS — Z9089 Acquired absence of other organs: Secondary | ICD-10-CM | POA: Insufficient documentation

## 2014-01-20 DIAGNOSIS — M75101 Unspecified rotator cuff tear or rupture of right shoulder, not specified as traumatic: Secondary | ICD-10-CM | POA: Diagnosis present

## 2014-01-20 DIAGNOSIS — Z885 Allergy status to narcotic agent status: Secondary | ICD-10-CM | POA: Insufficient documentation

## 2014-01-20 DIAGNOSIS — N289 Disorder of kidney and ureter, unspecified: Secondary | ICD-10-CM | POA: Insufficient documentation

## 2014-01-20 DIAGNOSIS — Z9889 Other specified postprocedural states: Secondary | ICD-10-CM | POA: Insufficient documentation

## 2014-01-20 DIAGNOSIS — Z79899 Other long term (current) drug therapy: Secondary | ICD-10-CM | POA: Insufficient documentation

## 2014-01-20 DIAGNOSIS — K219 Gastro-esophageal reflux disease without esophagitis: Secondary | ICD-10-CM | POA: Insufficient documentation

## 2014-01-20 DIAGNOSIS — M719 Bursopathy, unspecified: Principal | ICD-10-CM | POA: Insufficient documentation

## 2014-01-20 DIAGNOSIS — Z981 Arthrodesis status: Secondary | ICD-10-CM | POA: Insufficient documentation

## 2014-01-20 DIAGNOSIS — E785 Hyperlipidemia, unspecified: Secondary | ICD-10-CM | POA: Insufficient documentation

## 2014-01-20 DIAGNOSIS — M67919 Unspecified disorder of synovium and tendon, unspecified shoulder: Secondary | ICD-10-CM | POA: Insufficient documentation

## 2014-01-20 HISTORY — PX: SHOULDER ARTHROSCOPY WITH ROTATOR CUFF REPAIR: SHX5685

## 2014-01-20 LAB — POCT HEMOGLOBIN-HEMACUE: Hemoglobin: 13.6 g/dL (ref 12.0–15.0)

## 2014-01-20 SURGERY — ARTHROSCOPY, SHOULDER, WITH ROTATOR CUFF REPAIR
Anesthesia: General | Site: Shoulder | Laterality: Right

## 2014-01-20 MED ORDER — SODIUM CHLORIDE 0.9 % IR SOLN
Status: DC | PRN
  Administered 2014-01-20: 16500 mL

## 2014-01-20 MED ORDER — NEOSTIGMINE METHYLSULFATE 1 MG/ML IJ SOLN
INTRAMUSCULAR | Status: DC | PRN
  Administered 2014-01-20: 2 mg via INTRAVENOUS

## 2014-01-20 MED ORDER — DEXAMETHASONE SODIUM PHOSPHATE 4 MG/ML IJ SOLN
INTRAMUSCULAR | Status: DC | PRN
  Administered 2014-01-20: 10 mg via INTRAVENOUS

## 2014-01-20 MED ORDER — MIDAZOLAM HCL 2 MG/2ML IJ SOLN
INTRAMUSCULAR | Status: AC
Start: 2014-01-20 — End: 2014-01-20
  Filled 2014-01-20: qty 2

## 2014-01-20 MED ORDER — OXYCODONE-ACETAMINOPHEN 10-325 MG PO TABS: 1.0000 | ORAL_TABLET | Freq: Four times a day (QID) | ORAL | Status: DC | PRN

## 2014-01-20 MED ORDER — METHOCARBAMOL 500 MG PO TABS: 500.0000 mg | ORAL_TABLET | Freq: Four times a day (QID) | ORAL | Status: DC

## 2014-01-20 MED ORDER — LACTATED RINGERS IV SOLN
INTRAVENOUS | Status: DC
  Administered 2014-01-20 (×2): via INTRAVENOUS

## 2014-01-20 MED ORDER — PROPOFOL 10 MG/ML IV BOLUS
INTRAVENOUS | Status: DC | PRN
  Administered 2014-01-20: 150 mg via INTRAVENOUS

## 2014-01-20 MED ORDER — CEFAZOLIN SODIUM-DEXTROSE 2-3 GM-% IV SOLR
INTRAVENOUS | Status: DC | PRN
  Administered 2014-01-20: 2 g via INTRAVENOUS

## 2014-01-20 MED ORDER — FENTANYL CITRATE 0.05 MG/ML IJ SOLN
INTRAMUSCULAR | Status: AC
  Filled 2014-01-20: qty 6

## 2014-01-20 MED ORDER — ROCURONIUM BROMIDE 100 MG/10ML IV SOLN
INTRAVENOUS | Status: DC | PRN
  Administered 2014-01-20: 30 mg via INTRAVENOUS

## 2014-01-20 MED ORDER — GLYCOPYRROLATE 0.2 MG/ML IJ SOLN
INTRAMUSCULAR | Status: DC | PRN
  Administered 2014-01-20: .4 mg via INTRAVENOUS

## 2014-01-20 MED ORDER — MIDAZOLAM HCL 2 MG/2ML IJ SOLN
1.0000 mg | INTRAMUSCULAR | Status: DC | PRN
  Administered 2014-01-20: 2 mg via INTRAVENOUS

## 2014-01-20 MED ORDER — FENTANYL CITRATE 0.05 MG/ML IJ SOLN
INTRAMUSCULAR | Status: DC | PRN
  Administered 2014-01-20: 100 ug via INTRAVENOUS
  Administered 2014-01-20: 50 ug via INTRAVENOUS

## 2014-01-20 MED ORDER — HYDROMORPHONE HCL PF 1 MG/ML IJ SOLN: 0.2500 mg | INTRAMUSCULAR | Status: DC | PRN

## 2014-01-20 MED ORDER — MIDAZOLAM HCL 2 MG/2ML IJ SOLN
INTRAMUSCULAR | Status: AC
  Filled 2014-01-20: qty 2

## 2014-01-20 MED ORDER — CEFAZOLIN SODIUM-DEXTROSE 2-3 GM-% IV SOLR
INTRAVENOUS | Status: AC
  Filled 2014-01-20: qty 50

## 2014-01-20 MED ORDER — MIDAZOLAM HCL 5 MG/5ML IJ SOLN
INTRAMUSCULAR | Status: DC | PRN
  Administered 2014-01-20: 2 mg via INTRAVENOUS

## 2014-01-20 MED ORDER — PROPOFOL 10 MG/ML IV EMUL
INTRAVENOUS | Status: AC
  Filled 2014-01-20: qty 50

## 2014-01-20 MED ORDER — LIDOCAINE HCL (CARDIAC) 20 MG/ML IV SOLN
INTRAVENOUS | Status: DC | PRN
  Administered 2014-01-20: 50 mg via INTRAVENOUS

## 2014-01-20 MED ORDER — ONDANSETRON HCL 4 MG/2ML IJ SOLN: 4.0000 mg | Freq: Once | INTRAMUSCULAR | Status: DC | PRN

## 2014-01-20 MED ORDER — SUCCINYLCHOLINE CHLORIDE 20 MG/ML IJ SOLN
INTRAMUSCULAR | Status: AC
  Filled 2014-01-20: qty 1

## 2014-01-20 MED ORDER — OXYCODONE HCL 5 MG PO TABS: 5.0000 mg | ORAL_TABLET | Freq: Once | ORAL | Status: DC | PRN

## 2014-01-20 MED ORDER — OXYCODONE HCL 5 MG/5ML PO SOLN: 5.0000 mg | Freq: Once | ORAL | Status: DC | PRN

## 2014-01-20 MED ORDER — PROMETHAZINE HCL 25 MG PO TABS: 25.0000 mg | ORAL_TABLET | Freq: Four times a day (QID) | ORAL | Status: DC | PRN

## 2014-01-20 MED ORDER — FENTANYL CITRATE 0.05 MG/ML IJ SOLN
INTRAMUSCULAR | Status: AC
  Filled 2014-01-20: qty 2

## 2014-01-20 MED ORDER — FENTANYL CITRATE 0.05 MG/ML IJ SOLN
50.0000 ug | INTRAMUSCULAR | Status: DC | PRN
  Administered 2014-01-20: 100 ug via INTRAVENOUS

## 2014-01-20 SURGICAL SUPPLY — 75 items
ANCH SUT SWLK 19.1X5.5 CLS EL (Anchor) ×2 IMPLANT
ANCHOR PEEK SWIVEL LOCK 5.5 (Anchor) ×4 IMPLANT
APL SKNCLS STERI-STRIP NONHPOA (GAUZE/BANDAGES/DRESSINGS) ×1
BENZOIN TINCTURE PRP APPL 2/3 (GAUZE/BANDAGES/DRESSINGS) ×3 IMPLANT
BLADE CUTTER GATOR 3.5 (BLADE) ×3 IMPLANT
BLADE GREAT WHITE 4.2 (BLADE) IMPLANT
BLADE GREAT WHITE 4.2MM (BLADE)
BLADE SURG 15 STRL LF DISP TIS (BLADE) IMPLANT
BLADE SURG 15 STRL SS (BLADE)
BUR OVAL 4.0 (BURR) IMPLANT
BUR OVAL 6.0 (BURR) IMPLANT
CANNULA 5.75X71 LONG (CANNULA) ×3 IMPLANT
CANNULA TWIST IN 8.25X7CM (CANNULA) ×4 IMPLANT
CLOSURE WOUND 1/2 X4 (GAUZE/BANDAGES/DRESSINGS) ×1
DECANTER SPIKE VIAL GLASS SM (MISCELLANEOUS) IMPLANT
DRAPE INCISE IOBAN 66X45 STRL (DRAPES) ×3 IMPLANT
DRAPE SHOULDER BEACH CHAIR (DRAPES) ×3 IMPLANT
DRAPE U 20/CS (DRAPES) ×3 IMPLANT
DRAPE U-SHAPE 47X51 STRL (DRAPES) ×3 IMPLANT
DURAPREP 26ML APPLICATOR (WOUND CARE) ×3 IMPLANT
ELECT REM PT RETURN 9FT ADLT (ELECTROSURGICAL)
ELECTRODE REM PT RTRN 9FT ADLT (ELECTROSURGICAL) ×1 IMPLANT
FIBERSTICK 2 (SUTURE) IMPLANT
GLOVE BIO SURGEON STRL SZ 6.5 (GLOVE) ×1 IMPLANT
GLOVE BIO SURGEON STRL SZ8 (GLOVE) ×3 IMPLANT
GLOVE BIO SURGEONS STRL SZ 6.5 (GLOVE) ×1
GLOVE BIOGEL PI IND STRL 7.0 (GLOVE) IMPLANT
GLOVE BIOGEL PI IND STRL 8 (GLOVE) ×2 IMPLANT
GLOVE BIOGEL PI INDICATOR 7.0 (GLOVE) ×4
GLOVE BIOGEL PI INDICATOR 8 (GLOVE) ×4
GLOVE ECLIPSE 6.5 STRL STRAW (GLOVE) ×2 IMPLANT
GLOVE ORTHO TXT STRL SZ7.5 (GLOVE) ×3 IMPLANT
GOWN STRL REUS W/ TWL LRG LVL3 (GOWN DISPOSABLE) ×1 IMPLANT
GOWN STRL REUS W/ TWL XL LVL3 (GOWN DISPOSABLE) IMPLANT
GOWN STRL REUS W/TWL LRG LVL3 (GOWN DISPOSABLE) ×3
GOWN STRL REUS W/TWL XL LVL3 (GOWN DISPOSABLE) ×11 IMPLANT
IMMOBILIZER SHOULDER XLGE (ORTHOPEDIC SUPPLIES) IMPLANT
IV NS IRRIG 3000ML ARTHROMATIC (IV SOLUTION) ×12 IMPLANT
KIT SHOULDER TRACTION (DRAPES) ×3 IMPLANT
MANIFOLD NEPTUNE II (INSTRUMENTS) ×3 IMPLANT
NDL SCORPION MULTI FIRE (NEEDLE) ×1 IMPLANT
NEEDLE SCORPION MULTI FIRE (NEEDLE) ×3 IMPLANT
PACK ARTHROSCOPY DSU (CUSTOM PROCEDURE TRAY) ×3 IMPLANT
PACK BASIN DAY SURGERY FS (CUSTOM PROCEDURE TRAY) ×3 IMPLANT
PAD ABD 8X10 STRL (GAUZE/BANDAGES/DRESSINGS) ×3 IMPLANT
SET ARTHROSCOPY TUBING (MISCELLANEOUS) ×3
SET ARTHROSCOPY TUBING LN (MISCELLANEOUS) ×1 IMPLANT
SHEET MEDIUM DRAPE 40X70 STRL (DRAPES) ×3 IMPLANT
SLEEVE SCD COMPRESS KNEE MED (MISCELLANEOUS) ×3 IMPLANT
SLING ARM IMMOBILIZER LRG (SOFTGOODS) ×2 IMPLANT
SLING ARM IMMOBILIZER MED (SOFTGOODS) IMPLANT
SLING ARM LRG ADULT FOAM STRAP (SOFTGOODS) IMPLANT
SLING ARM MED ADULT FOAM STRAP (SOFTGOODS) IMPLANT
SLING ARM XL FOAM STRAP (SOFTGOODS) IMPLANT
SPONGE GAUZE 4X4 12PLY (GAUZE/BANDAGES/DRESSINGS) ×3 IMPLANT
STRIP CLOSURE SKIN 1/2X4 (GAUZE/BANDAGES/DRESSINGS) ×2 IMPLANT
SUT FIBERWIRE #2 38 T-5 BLUE (SUTURE)
SUT LASSO 45 DEGREE (SUTURE) IMPLANT
SUT LASSO 45 DEGREE LEFT (SUTURE) IMPLANT
SUT LASSO 45D RIGHT (SUTURE) IMPLANT
SUT MNCRL AB 4-0 PS2 18 (SUTURE) ×2 IMPLANT
SUT PDS AB 0 CT 36 (SUTURE) ×3 IMPLANT
SUT PDS AB 1 CT  36 (SUTURE)
SUT PDS AB 1 CT 36 (SUTURE) IMPLANT
SUT TIGER TAPE 7 IN WHITE (SUTURE) ×2 IMPLANT
SUT VIC AB 3-0 SH 27 (SUTURE)
SUT VIC AB 3-0 SH 27X BRD (SUTURE) IMPLANT
SUTURE FIBERWR #2 38 T-5 BLUE (SUTURE) IMPLANT
TAPE FIBER 2MM 7IN #2 BLUE (SUTURE) ×2 IMPLANT
TOWEL OR 17X24 6PK STRL BLUE (TOWEL DISPOSABLE) ×3 IMPLANT
TOWEL OR NON WOVEN STRL DISP B (DISPOSABLE) ×3 IMPLANT
TUBE CONNECTING 20'X1/4 (TUBING) ×1
TUBE CONNECTING 20X1/4 (TUBING) ×2 IMPLANT
WAND STAR VAC 90 (SURGICAL WAND) ×3 IMPLANT
WATER STERILE IRR 1000ML POUR (IV SOLUTION) ×3 IMPLANT

## 2014-01-20 NOTE — Anesthesia Procedure Notes (Addendum)
Anesthesia Regional Block:  Interscalene brachial plexus block  Pre-Anesthetic Checklist: ,, timeout performed, Correct Patient, Correct Site, Correct Laterality, Correct Procedure, Correct Position, site marked, Risks and benefits discussed,  Surgical consent,  Pre-op evaluation,  At surgeon's request and post-op pain management  Laterality: Right  Prep: chloraprep       Needles:  Injection technique: Single-shot  Needle Type: Echogenic Stimulator Needle      Needle Gauge: 22 and 22 G    Additional Needles:  Procedures: ultrasound guided (picture in chart) and nerve stimulator Interscalene brachial plexus block Narrative:  Start time: 01/20/2014 8:00 AM End time: 01/20/2014 8:05 AM Injection made incrementally with aspirations every 5 mL.  Performed by: Personally   Additional Notes: 20 cc 0.5% marcaine 1:200 Epi 8 mg decadron.   Procedure Name: Intubation Date/Time: 01/20/2014 9:19 AM Performed by: Melynda Ripple D Pre-anesthesia Checklist: Patient identified, Emergency Drugs available, Suction available and Patient being monitored Patient Re-evaluated:Patient Re-evaluated prior to inductionOxygen Delivery Method: Circle System Utilized Preoxygenation: Pre-oxygenation with 100% oxygen Intubation Type: IV induction Ventilation: Mask ventilation without difficulty Laryngoscope Size: Mac and 3 Grade View: Grade I Tube type: Oral Tube size: 7.0 mm Number of attempts: 1 Airway Equipment and Method: stylet and oral airway Placement Confirmation: ETT inserted through vocal cords under direct vision,  positive ETCO2,  breath sounds checked- equal and bilateral and CO2 detector Secured at: 22 cm Tube secured with: L lip. Dental Injury: Teeth and Oropharynx as per pre-operative assessment

## 2014-01-20 NOTE — Discharge Instructions (Signed)
Diet: As you were doing prior to hospitalization  ° °Shower:  May shower but keep the wounds dry, use an occlusive plastic wrap, NO SOAKING IN TUB.  If the bandage gets wet, change with a clean dry gauze. ° °Dressing:  You may change your dressing 3-5 days after surgery.  Then change the dressing daily with sterile gauze dressing.   ° °There are sticky tapes (steri-strips) on your wounds and all the stitches are absorbable.  Leave the steri-strips in place when changing your dressings, they will peel off with time, usually 2-3 weeks. ° °Activity:  Increase activity slowly as tolerated, but follow the weight bearing instructions below.  No lifting or driving for 6 weeks. ° °Weight Bearing:   Sling at all times..   ° °To prevent constipation: you may use a stool softener such as - ° °Colace (over the counter) 100 mg by mouth twice a day  °Drink plenty of fluids (prune juice may be helpful) and high fiber foods °Miralax (over the counter) for constipation as needed.   ° °Itching:  If you experience itching with your medications, try taking only a single pain pill, or even half a pain pill at a time.  You may take up to 10 pain pills per day, and you can also use benadryl over the counter for itching or also to help with sleep.  ° °Precautions:  If you experience chest pain or shortness of breath - call 911 immediately for transfer to the hospital emergency department!! ° °If you develop a fever greater that 101 F, purulent drainage from wound, increased redness or drainage from wound, or calf pain -- Call the office at [phone]                                                °Follow- Up Appointment:  Please call for an appointment to be seen in 2 weeks [place] - [phone] ° ° °Post Anesthesia Home Care Instructions ° °Activity: °Get plenty of rest for the remainder of the day. A responsible adult should stay with you for 24 hours following the procedure.  °For the next 24 hours, DO NOT: °-Drive a  car °-Operate machinery °-Drink alcoholic beverages °-Take any medication unless instructed by your physician °-Make any legal decisions or sign important papers. ° °Meals: °Start with liquid foods such as gelatin or soup. Progress to regular foods as tolerated. Avoid greasy, spicy, heavy foods. If nausea and/or vomiting occur, drink only clear liquids until the nausea and/or vomiting subsides. Call your physician if vomiting continues. ° °Special Instructions/Symptoms: °Your throat may feel dry or sore from the anesthesia or the breathing tube placed in your throat during surgery. If this causes discomfort, gargle with warm salt water. The discomfort should disappear within 24 hours. ° ° °Regional Anesthesia Blocks ° °1. Numbness or the inability to move the "blocked" extremity may last from 3-48 hours after placement. The length of time depends on the medication injected and your individual response to the medication. If the numbness is not going away after 48 hours, call your surgeon. ° °2. The extremity that is blocked will need to be protected until the numbness is gone and the  Strength has returned. Because you cannot feel it, you will need to take extra care to avoid injury. Because it may be weak, you may have difficulty moving   it or using it. You may not know what position it is in without looking at it while the block is in effect. ° °3. For blocks in the legs and feet, returning to weight bearing and walking needs to be done carefully. You will need to wait until the numbness is entirely gone and the strength has returned. You should be able to move your leg and foot normally before you try and bear weight or walk. You will need someone to be with you when you first try to ensure you do not fall and possibly risk injury. ° °4. Bruising and tenderness at the needle site are common side effects and will resolve in a few days. ° °5. Persistent numbness or new problems with movement should be communicated to  the surgeon or the [facility] ([phone])/ [facility] ([phone]). ° ° ° °

## 2014-01-20 NOTE — H&P (Signed)
PREOPERATIVE H&P  Chief Complaint: Right shoulder pain  HPI: Briana Wolf is a 63 y.o. female who presents for preoperative history and physical with a diagnosis of recurrent right shoulder rotator cuff tear. Symptoms are rated as moderate to severe, and have been worsening.  This is significantly impairing activities of daily living.  She has elected for surgical management. She had an arthroscopic rotator cuff repair performed last year, and did well for a period of about 4 months, and then had progressive increase in shoulder pain.  Past Medical History  Diagnosis Date  . Hyperlipidemia   . Hypertension   . GERD (gastroesophageal reflux disease)   . Arthritis   . Family history of anesthesia complication     mom allergic-to many meds-multiple medical problems  . Renal insufficiency   . Contact lens/glasses fitting     wears contacts or glasses at times  . Tear of right rotator cuff 08/05/2013   Past Surgical History  Procedure Laterality Date  . Rotator cuff repair  1997    let shoulder   . Carpal tunnel release  1987    left and right wrist   . Foot surgery  2005    bilateral   . Back surgery  2008    cervical fusion   . Back surgery  1998    lumb disk  . Tonsillectomy    . Dilation and curettage of uterus  2006  . Tubal ligation    . Colonoscopy    . Elbow arthroscopy      ulnar nerve-rt  . Shoulder arthroscopy with rotator cuff repair and subacromial decompression Right 08/05/2013    Procedure: RIGHT SHOULDER ARTHROSCOPY WITH EXTENSIVE DEBRIDEMENT, ROTATOR CUFF REPAIR AND SUBACROMIAL DECOMPRESSION;  Surgeon: Johnny Bridge, MD;  Location: [facility];  Service: Orthopedics;  Laterality: Right;   History   Social History  . Marital Status: Married    Spouse Name: N/A    Number of Children: N/A  . Years of Education: N/A   Social History Main Topics  . Smoking status: Never Smoker   . Smokeless tobacco: None  . Alcohol Use: Yes  . Drug Use:  No  . Sexual Activity: None   Other Topics Concern  . None   Social History Narrative  . None   Family History  Problem Relation Age of Onset  . Diabetes Mother   . Heart disease Father    Allergies  Allergen Reactions  . Codeine Nausea Only    Makes pt nauseous    Prior to Admission medications   Medication Sig Start Date End Date Taking? Authorizing Provider  buPROPion (WELLBUTRIN XL) 150 MG 24 hr tablet Take 450 mg by mouth every morning.   Yes Historical Provider, MD  Dexlansoprazole (DEXILANT) 30 MG capsule Take 30 mg by mouth every morning.   Yes Historical Provider, MD  HYDROcodone-acetaminophen (NORCO/VICODIN) 5-325 MG per tablet Take 1 tablet by mouth every 6 (six) hours as needed for moderate pain.   Yes Historical Provider, MD  omega-3 acid ethyl esters (LOVAZA) 1 G capsule Take 2 g by mouth every morning.   Yes Historical Provider, MD  sennosides-docusate sodium (SENOKOT-S) 8.6-50 MG tablet Take 2 tablets by mouth daily. 08/05/13  Yes Johnny Bridge, MD  traZODone (DESYREL) 50 MG tablet Take 50 mg by mouth at bedtime.   Yes Historical Provider, MD  triamterene-hydrochlorothiazide (MAXZIDE-25) 37.5-25 MG per tablet Take 1 tablet by mouth every evening.  07/08/12  Yes Historical  Provider, MD  Cyanocobalamin (VITAMIN B-12) 1000 MCG SUBL Place 1 tablet under the tongue every morning.    Historical Provider, MD  methocarbamol (ROBAXIN) 500 MG tablet Take 1 tablet (500 mg total) by mouth 4 (four) times daily. 08/05/13   Johnny Bridge, MD  promethazine (PHENERGAN) 25 MG tablet Take 1 tablet (25 mg total) by mouth every 6 (six) hours as needed for nausea. 08/05/13   Johnny Bridge, MD  traMADol (ULTRAM) 50 MG tablet Take 50 mg by mouth every 6 (six) hours as needed. For pain 09/03/12   Historical Provider, MD     Positive ROS: All other systems have been reviewed and were otherwise negative with the exception of those mentioned in the HPI and as above.  Physical  Exam: General: Alert, no acute distress Cardiovascular: No pedal edema Respiratory: No cyanosis, no use of accessory musculature GI: No organomegaly, abdomen is soft and non-tender Skin: No lesions in the area of chief complaint Neurologic: Sensation intact distally Psychiatric: Patient is competent for consent with normal mood and affect Lymphatic: No axillary or cervical lymphadenopathy  MUSCULOSKELETAL: Right shoulder has well-healed surgical wounds, active motion 0-100, significant pain with weakness with supraspinatus testing.  Assessment: Right shoulder recurrent rotator cuff tear  Plan: Plan for Procedure(s): RIGHT SHOULDER ARTHROSCOPY DEBRIDEMENT EXTENTSIVE,ARTHROSCOPY SHOULDER WITH  WITH possible ROTATOR CUFF REPAIR  The risks benefits and alternatives were discussed with the patient including but not limited to the risks of nonoperative treatment, versus surgical intervention including infection, bleeding, nerve injury,  blood clots, cardiopulmonary complications, morbidity, mortality, among others, and they were willing to proceed. We've also discussed the risks for persistent pain, weakness with overhead function, inability to repair the rotator cuff, among others.  Johnny Bridge, MD Cell [phone]   01/20/2014 9:05 AM

## 2014-01-20 NOTE — Anesthesia Preprocedure Evaluation (Signed)
Anesthesia Evaluation  Patient identified by MRN, date of birth, ID band Patient awake    Reviewed: Allergy & Precautions, H&P , NPO status , Patient's Chart, lab work & pertinent test results  Airway Mallampati: II TM Distance: >3 FB Neck ROM: Full    Dental  (+) Teeth Intact and Dental Advisory Given   Pulmonary  breath sounds clear to auscultation        Cardiovascular hypertension, Rhythm:Regular Rate:Normal     Neuro/Psych    GI/Hepatic   Endo/Other    Renal/GU      Musculoskeletal   Abdominal   Peds  Hematology   Anesthesia Other Findings   Reproductive/Obstetrics                           Anesthesia Physical Anesthesia Plan  ASA: II  Anesthesia Plan: General   Post-op Pain Management:    Induction: Intravenous  Airway Management Planned: Oral ETT  Additional Equipment:   Intra-op Plan:   Post-operative Plan: Extubation in OR  Informed Consent: I have reviewed the patients History and Physical, chart, labs and discussed the procedure including the risks, benefits and alternatives for the proposed anesthesia with the patient or authorized representative who has indicated his/her understanding and acceptance.   Dental advisory given  Plan Discussed with: CRNA and Anesthesiologist  Anesthesia Plan Comments:         Anesthesia Quick Evaluation

## 2014-01-20 NOTE — Transfer of Care (Signed)
Immediate Anesthesia Transfer of Care Note  Patient: Briana Wolf  Procedure(s) Performed: Procedure(s): RIGHT SHOULDER ARTHROSCOPY WITH EXTENSIVE  DEBRIDEMENT, REVISION  ROTATOR CUFF REPAIR AND BICEPS TENOTOMY (Right)  Patient Location: PACU  Anesthesia Type:General  Level of Consciousness: awake and sedated  Airway & Oxygen Therapy: Patient Spontanous Breathing and Patient connected to face mask oxygen  Post-op Assessment: Report given to PACU RN and Post -op Vital signs reviewed and stable  Post vital signs: Reviewed and stable  Complications: No apparent anesthesia complications

## 2014-01-20 NOTE — Op Note (Signed)
01/20/2014  11:27 AM  PATIENT:  Briana Wolf    PRE-OPERATIVE DIAGNOSIS: Right shoulder recurrent rotator cuff tear, biceps tendinosis  POST-OPERATIVE DIAGNOSIS:  Same  PROCEDURE:  RIGHT SHOULDER ARTHROSCOPY WITH EXTENSIVE  DEBRIDEMENT, REVISION  ROTATOR CUFF REPAIR AND BICEPS TENOTOMY  SURGEON:  Johnny Bridge, MD  PHYSICIAN ASSISTANT: Joya Gaskins, OPA-C, present and scrubbed throughout the case, critical for completion in a timely fashion, and for retraction, instrumentation, and closure.  ANESTHESIA:   General  PREOPERATIVE INDICATIONS:  Briana Wolf is a  63 y.o. female whose dog was attacking her daughter, and she reached out to grab the dog and protect the daughter, pulling them apart, 4 months after a right shoulder massive rotator cuff repair. She tore her tendon again, and elected for surgical management.  The risks benefits and alternatives were discussed with the patient preoperatively including but not limited to the risks of infection, bleeding, nerve injury, cardiopulmonary complications, the need for revision surgery, among others, and the patient was willing to proceed.  We also discussed the risks for Popeye deformity, recurrent rotator cuff tear, weakness, inability to regain overhead function.  OPERATIVE IMPLANTS: Arthrex peak 5.5 mm anchors x2 with inverted fiber tape x2, one anteriorly and one posteriorly.  OPERATIVE FINDINGS: The biceps had a moderate amount of tendinosis, as well as fraying particularly within the intertubercular groove. There is a little bit of labral fraying as well. The delaminated rotator cuff tear had re\re torn, and the previous anchor had mostly absorbed, and was fragmented, and the eyelid was still contained within the loop of the rescue stitch the guide used for the posterior dog ear. The cuff tendon itself was retracted, and again was fairly poor quality, with delamination. There was moderate muscular atrophy. There is no significant  subacromial impingement. The glenohumeral articular cartilage was in reasonably good condition. The tendon was still able to be mobilized and drawn to the level of the articular margin, although not further. There was an empty crater laterally where the previous anchor had been placed.  OPERATIVE PROCEDURE: The patient was brought to the operating room and placed in the supine position. General anesthesia was administered. IV antibiotics were given. The right upper extremity was prepped and draped in usual sterile fashion. Time out was performed.  Diagnostic arthroscopy was carried out with the above-named findings. I used the arthroscopic shaver to debride the anterior labrum, and the ArthroCare cautery to perform a tenolysis.  I then went to the subacromial space. I inspected the remaining strands of the FiberWire and fiber tape suture, and cut one of them and removed it, and then searched for the remaining anchor. I found this buried in the soft tissue underneath the a.c. joint. This was removed. I debrided the poor quality portions of the supraspinatus, and prepared the tuberosity for reinsertion. I placed a total of 2 inverted fiber tape, the first one using a BirdBeak posteriorly, along with the scorpion anteriorly, and then a scorpion for both anterior passes. This had excellent purchase on the tissue. I placed the superior cannula, and brought to the posterior sutures through the cannula, and anchored them well into the head. This was just lateral to the articular margin.  I then placed a second anchor anteriorly through a separate portal, again going just at the junction between the bicipital groove and the articular margin.  The instruments were removed, the shoulder drained and injected, the portals closed with Monocryl followed by Steri-Strips and sterile gauze, and the patient  awakened and returned to PACU in stable and satisfactory condition.

## 2014-01-20 NOTE — Progress Notes (Signed)
Assisted Dr. Joslin with right, ultrasound guided, interscalene  block. Side rails up, monitors on throughout procedure. See vital signs in flow sheet. Tolerated Procedure well. 

## 2014-01-23 ENCOUNTER — Encounter (HOSPITAL_BASED_OUTPATIENT_CLINIC_OR_DEPARTMENT_OTHER): Payer: Self-pay | Admitting: Orthopedic Surgery

## 2014-02-05 NOTE — Anesthesia Postprocedure Evaluation (Signed)
  Anesthesia Post-op Note  Patient: Briana Wolf  Procedure(s) Performed: Procedure(s): RIGHT SHOULDER ARTHROSCOPY WITH EXTENSIVE  DEBRIDEMENT, REVISION  ROTATOR CUFF REPAIR AND BICEPS TENOTOMY (Right)  Patient Location: PACU  Anesthesia Type:General and GA combined with regional for post-op pain  Level of Consciousness: awake, alert  and oriented  Airway and Oxygen Therapy: Patient Spontanous Breathing  Post-op Pain: none  Post-op Assessment: Post-op Vital signs reviewed, Patient's Cardiovascular Status Stable, Respiratory Function Stable, Patent Airway and Pain level controlled  Post-op Vital Signs: stable  Complications: No apparent anesthesia complications

## 2014-05-23 ENCOUNTER — Other Ambulatory Visit: Payer: Self-pay | Admitting: Neurosurgery

## 2014-05-31 NOTE — Pre-Procedure Instructions (Signed)
Briana Wolf  05/31/2014   Your procedure is scheduled on: Tuesday, June 06, 2014   Report to [facility] (use Main Entrance "A'') at 7:30 AM.( per MD)  Call this number if you have problems the morning of surgery: [phone]   Remember:   Do not eat food or drink liquids after midnight.   Take these medicines the morning of surgery with A SIP OF WATER: WELLBUTRIN XL, DEXILANT)   If needed: pain medication, PHENERGAN for nausea Stop taking Aspirin, vitamins and herbal medications ( omega-3 acid ethyl esters (LOVAZA) Do not take any NSAIDs ie: Ibuprofen, Advil, Naproxen or any medication containing Aspirin.  Do not wear jewelry, make-up or nail polish.  Do not wear lotions, powders, or perfumes.   Do not shave 48 hours prior to surgery. Men may shave face and neck.  Do not bring valuables to the hospital.  [facility] is not responsible for any belongings or valuables.               Contacts, dentures or bridgework may not be worn into surgery.  Leave suitcase in the car. After surgery it may be brought to your room.  For patients admitted to the hospital, discharge time is determined by your treatment team.             Patients discharged the day of surgery will not be allowed to drive home.  Name and phone number of your driver:   Special Instructions:  Special Instructions:Special Instructions: [facility] - Preparing for Surgery  Before surgery, you can play an important role.  Because skin is not sterile, your skin needs to be as free of germs as possible.  You can reduce the number of germs on you skin by washing with CHG (chlorahexidine gluconate) soap before surgery.  CHG is an antiseptic cleaner which kills germs and bonds with the skin to continue killing germs even after washing.  Please DO NOT use if you have an allergy to CHG or antibacterial soaps.  If your skin becomes reddened/irritated stop using the CHG and inform your nurse when you arrive at [facility].  Do not shave (including legs and underarms) for at least 48 hours prior to the first CHG shower.  You may shave your face.  Please follow these instructions carefully:   1.  Shower with CHG Soap the night before surgery and the morning of Surgery.  2.  If you choose to wash your hair, wash your hair first as usual with your normal shampoo.  3.  After you shampoo, rinse your hair and body thoroughly to remove the Shampoo.  4.  Use CHG as you would any other liquid soap.  You can apply chg directly  to the skin and wash gently with scrungie or a clean washcloth.  5.  Apply the CHG Soap to your body ONLY FROM THE NECK DOWN.  Do not use on open wounds or open sores.  Avoid contact with your eyes, ears, mouth and genitals (private parts).  Wash genitals (private parts) with your normal soap.  6.  Wash thoroughly, paying special attention to the area where your surgery will be performed.  7.  Thoroughly rinse your body with warm water from the neck down.  8.  DO NOT shower/wash with your normal soap after using and rinsing off the CHG Soap.  9.  Pat yourself dry with a clean towel.  10.  Wear clean pajamas.            11.  Place clean sheets on your bed the night of your first shower and do not sleep with pets.  Day of Surgery  Do not apply any lotions the morning of surgery.  Please wear clean clothes to the hospital/surgery center.   Please read over the following fact sheets that you were given: Pain Booklet, Coughing and Deep Breathing, MRSA Information and Surgical Site Infection Prevention

## 2014-06-01 ENCOUNTER — Encounter (HOSPITAL_COMMUNITY)
Admission: RE | Admit: 2014-06-01 | Discharge: 2014-06-01 | Disposition: A | Discharge status: Home / Self Care | Payer: 59 | Source: Ambulatory Visit | Attending: Neurosurgery | Admitting: Neurosurgery

## 2014-06-01 ENCOUNTER — Encounter (HOSPITAL_COMMUNITY): Payer: Self-pay

## 2014-06-01 ENCOUNTER — Encounter (HOSPITAL_COMMUNITY): Payer: Self-pay | Admitting: Pharmacy Technician

## 2014-06-01 ENCOUNTER — Encounter (HOSPITAL_COMMUNITY)
Admission: RE | Admit: 2014-06-01 | Discharge: 2014-06-01 | Disposition: A | Discharge status: Home / Self Care | Payer: 59 | Source: Ambulatory Visit | Attending: Anesthesiology | Admitting: Anesthesiology

## 2014-06-01 DIAGNOSIS — Z01818 Encounter for other preprocedural examination: Secondary | ICD-10-CM | POA: Insufficient documentation

## 2014-06-01 DIAGNOSIS — Z01810 Encounter for preprocedural cardiovascular examination: Secondary | ICD-10-CM | POA: Insufficient documentation

## 2014-06-01 DIAGNOSIS — Z01812 Encounter for preprocedural laboratory examination: Secondary | ICD-10-CM | POA: Insufficient documentation

## 2014-06-01 HISTORY — DX: Cough: R05

## 2014-06-01 HISTORY — DX: Depression, unspecified: F32.A

## 2014-06-01 HISTORY — DX: Major depressive disorder, single episode, unspecified: F32.9

## 2014-06-01 HISTORY — DX: Cough, unspecified: R05.9

## 2014-06-01 LAB — BASIC METABOLIC PANEL
BUN: 24 mg/dL — AB (ref 6–23)
CO2: 31 mEq/L (ref 19–32)
Calcium: 9.7 mg/dL (ref 8.4–10.5)
Chloride: 98 mEq/L (ref 96–112)
Creatinine, Ser: 1.16 mg/dL — ABNORMAL HIGH (ref 0.50–1.10)
GFR calc non Af Amer: 50 mL/min — ABNORMAL LOW (ref >90)
GFR, EST AFRICAN AMERICAN: 58 mL/min — AB (ref >90)
GLUCOSE: 65 mg/dL — AB (ref 70–99)
POTASSIUM: 3.3 meq/L — AB (ref 3.7–5.3)
Sodium: 141 mEq/L (ref 137–147)

## 2014-06-01 LAB — CBC
HEMATOCRIT: 41.9 % (ref 36.0–46.0)
HEMOGLOBIN: 14.1 g/dL (ref 12.0–15.0)
MCH: 30.7 pg (ref 26.0–34.0)
MCHC: 33.7 g/dL (ref 30.0–36.0)
MCV: 91.1 fL (ref 78.0–100.0)
Platelets: 266 10*3/uL (ref 150–400)
RBC: 4.6 MIL/uL (ref 3.87–5.11)
RDW: 12.3 % (ref 11.5–15.5)
WBC: 6.6 10*3/uL (ref 4.0–10.5)

## 2014-06-01 LAB — SURGICAL PCR SCREEN
MRSA, PCR: NEGATIVE
Staphylococcus aureus: NEGATIVE

## 2014-06-05 MED ORDER — CEFAZOLIN SODIUM-DEXTROSE 2-3 GM-% IV SOLR
2.0000 g | INTRAVENOUS | Status: AC
  Administered 2014-06-06: 2 g via INTRAVENOUS
  Filled 2014-06-05: qty 50

## 2014-06-06 ENCOUNTER — Ambulatory Visit (HOSPITAL_COMMUNITY): Payer: 59

## 2014-06-06 ENCOUNTER — Inpatient Hospital Stay (HOSPITAL_COMMUNITY)
Admission: RE | Admit: 2014-06-06 | Discharge: 2014-06-07 | DRG: 520 | Disposition: A | Discharge status: Home / Self Care | Payer: 59 | Source: Ambulatory Visit | Attending: Neurosurgery | Admitting: Neurosurgery

## 2014-06-06 ENCOUNTER — Encounter (HOSPITAL_COMMUNITY): Payer: 59 | Admitting: Anesthesiology

## 2014-06-06 ENCOUNTER — Ambulatory Visit (HOSPITAL_COMMUNITY): Payer: 59 | Admitting: Anesthesiology

## 2014-06-06 ENCOUNTER — Encounter (HOSPITAL_COMMUNITY): Payer: Self-pay | Admitting: Anesthesiology

## 2014-06-06 ENCOUNTER — Encounter (HOSPITAL_COMMUNITY)
Admission: RE | Disposition: A | Discharge status: Home / Self Care | Payer: Self-pay | Source: Ambulatory Visit | Attending: Neurosurgery

## 2014-06-06 DIAGNOSIS — F329 Major depressive disorder, single episode, unspecified: Secondary | ICD-10-CM | POA: Diagnosis present

## 2014-06-06 DIAGNOSIS — Z981 Arthrodesis status: Secondary | ICD-10-CM

## 2014-06-06 DIAGNOSIS — Z888 Allergy status to other drugs, medicaments and biological substances status: Secondary | ICD-10-CM

## 2014-06-06 DIAGNOSIS — M5380 Other specified dorsopathies, site unspecified: Secondary | ICD-10-CM | POA: Diagnosis present

## 2014-06-06 DIAGNOSIS — K219 Gastro-esophageal reflux disease without esophagitis: Secondary | ICD-10-CM | POA: Diagnosis present

## 2014-06-06 DIAGNOSIS — M5137 Other intervertebral disc degeneration, lumbosacral region: Secondary | ICD-10-CM | POA: Diagnosis present

## 2014-06-06 DIAGNOSIS — M48061 Spinal stenosis, lumbar region without neurogenic claudication: Secondary | ICD-10-CM | POA: Diagnosis present

## 2014-06-06 DIAGNOSIS — M5126 Other intervertebral disc displacement, lumbar region: Principal | ICD-10-CM | POA: Diagnosis present

## 2014-06-06 DIAGNOSIS — Z885 Allergy status to narcotic agent status: Secondary | ICD-10-CM

## 2014-06-06 DIAGNOSIS — F3289 Other specified depressive episodes: Secondary | ICD-10-CM | POA: Diagnosis present

## 2014-06-06 DIAGNOSIS — I10 Essential (primary) hypertension: Secondary | ICD-10-CM | POA: Diagnosis present

## 2014-06-06 DIAGNOSIS — M129 Arthropathy, unspecified: Secondary | ICD-10-CM | POA: Diagnosis present

## 2014-06-06 DIAGNOSIS — M51379 Other intervertebral disc degeneration, lumbosacral region without mention of lumbar back pain or lower extremity pain: Secondary | ICD-10-CM | POA: Diagnosis present

## 2014-06-06 DIAGNOSIS — E785 Hyperlipidemia, unspecified: Secondary | ICD-10-CM | POA: Diagnosis present

## 2014-06-06 HISTORY — PX: LUMBAR LAMINECTOMY/DECOMPRESSION MICRODISCECTOMY: SHX5026

## 2014-06-06 SURGERY — LUMBAR LAMINECTOMY/DECOMPRESSION MICRODISCECTOMY 1 LEVEL
Anesthesia: General | Site: Spine Lumbar | Laterality: Left

## 2014-06-06 MED ORDER — PHENOL 1.4 % MT LIQD: 1.0000 | OROMUCOSAL | Status: DC | PRN

## 2014-06-06 MED ORDER — LACTATED RINGERS IV SOLN
INTRAVENOUS | Status: DC
  Administered 2014-06-06: 08:00:00 via INTRAVENOUS

## 2014-06-06 MED ORDER — OMEGA-3-ACID ETHYL ESTERS 1 G PO CAPS
2.0000 g | ORAL_CAPSULE | Freq: Every morning | ORAL | Status: DC
  Filled 2014-06-06 (×2): qty 2

## 2014-06-06 MED ORDER — ROCURONIUM BROMIDE 100 MG/10ML IV SOLN
INTRAVENOUS | Status: DC | PRN
  Administered 2014-06-06: 40 mg via INTRAVENOUS

## 2014-06-06 MED ORDER — TRIAMTERENE-HCTZ 37.5-25 MG PO TABS
1.0000 | ORAL_TABLET | Freq: Every morning | ORAL | Status: DC
  Filled 2014-06-06 (×2): qty 1

## 2014-06-06 MED ORDER — ZOLPIDEM TARTRATE 5 MG PO TABS: 5.0000 mg | ORAL_TABLET | Freq: Every evening | ORAL | Status: DC | PRN

## 2014-06-06 MED ORDER — FENTANYL CITRATE 0.05 MG/ML IJ SOLN
25.0000 ug | INTRAMUSCULAR | Status: DC | PRN
  Administered 2014-06-06 (×3): 25 ug via INTRAVENOUS

## 2014-06-06 MED ORDER — DEXAMETHASONE SODIUM PHOSPHATE 10 MG/ML IJ SOLN
INTRAMUSCULAR | Status: DC | PRN
  Administered 2014-06-06: 10 mg via INTRAVENOUS

## 2014-06-06 MED ORDER — LIDOCAINE HCL (CARDIAC) 20 MG/ML IV SOLN
INTRAVENOUS | Status: AC
  Filled 2014-06-06: qty 5

## 2014-06-06 MED ORDER — PANTOPRAZOLE SODIUM 40 MG PO TBEC
40.0000 mg | DELAYED_RELEASE_TABLET | Freq: Every day | ORAL | Status: DC
  Administered 2014-06-07: 40 mg via ORAL
  Filled 2014-06-06: qty 1

## 2014-06-06 MED ORDER — SODIUM CHLORIDE 0.9 % IV SOLN
250.0000 mL | INTRAVENOUS | Status: DC
Start: 2014-06-06 — End: 2014-06-07

## 2014-06-06 MED ORDER — THROMBIN 5000 UNITS EX SOLR
CUTANEOUS | Status: DC | PRN
  Administered 2014-06-06 (×2): 5000 [IU] via TOPICAL

## 2014-06-06 MED ORDER — 0.9 % SODIUM CHLORIDE (POUR BTL) OPTIME
TOPICAL | Status: DC | PRN
  Administered 2014-06-06: 1000 mL

## 2014-06-06 MED ORDER — FENTANYL CITRATE 0.05 MG/ML IJ SOLN
INTRAMUSCULAR | Status: AC
  Filled 2014-06-06: qty 2

## 2014-06-06 MED ORDER — PROPOFOL 10 MG/ML IV BOLUS
INTRAVENOUS | Status: AC
  Filled 2014-06-06: qty 20

## 2014-06-06 MED ORDER — FENTANYL CITRATE 0.05 MG/ML IJ SOLN
INTRAMUSCULAR | Status: DC | PRN
  Administered 2014-06-06: 100 ug via INTRAVENOUS

## 2014-06-06 MED ORDER — NEOSTIGMINE METHYLSULFATE 10 MG/10ML IV SOLN
INTRAVENOUS | Status: AC
  Filled 2014-06-06: qty 1

## 2014-06-06 MED ORDER — BUPROPION HCL ER (XL) 300 MG PO TB24
450.0000 mg | ORAL_TABLET | Freq: Every morning | ORAL | Status: DC
  Administered 2014-06-07: 450 mg via ORAL
  Filled 2014-06-06 (×2): qty 1

## 2014-06-06 MED ORDER — ONDANSETRON HCL 4 MG/2ML IJ SOLN
INTRAMUSCULAR | Status: DC | PRN
  Administered 2014-06-06: 4 mg via INTRAVENOUS

## 2014-06-06 MED ORDER — TRAZODONE HCL 50 MG PO TABS
50.0000 mg | ORAL_TABLET | Freq: Every day | ORAL | Status: DC
  Administered 2014-06-06: 50 mg via ORAL
  Filled 2014-06-06 (×2): qty 1

## 2014-06-06 MED ORDER — PROPOFOL 10 MG/ML IV BOLUS
INTRAVENOUS | Status: DC | PRN
  Administered 2014-06-06: 160 mg via INTRAVENOUS

## 2014-06-06 MED ORDER — DIAZEPAM 5 MG PO TABS
5.0000 mg | ORAL_TABLET | Freq: Four times a day (QID) | ORAL | Status: DC | PRN
  Administered 2014-06-06: 5 mg via ORAL
  Filled 2014-06-06: qty 1

## 2014-06-06 MED ORDER — GLYCOPYRROLATE 0.2 MG/ML IJ SOLN
INTRAMUSCULAR | Status: AC
  Filled 2014-06-06: qty 3

## 2014-06-06 MED ORDER — HEMOSTATIC AGENTS (NO CHARGE) OPTIME
TOPICAL | Status: DC | PRN
  Administered 2014-06-06: 1 via TOPICAL

## 2014-06-06 MED ORDER — MIDAZOLAM HCL 5 MG/5ML IJ SOLN
INTRAMUSCULAR | Status: DC | PRN
  Administered 2014-06-06: 2 mg via INTRAVENOUS

## 2014-06-06 MED ORDER — DIPHENHYDRAMINE HCL 25 MG PO CAPS
25.0000 mg | ORAL_CAPSULE | Freq: Four times a day (QID) | ORAL | Status: DC | PRN
  Administered 2014-06-06: 25 mg via ORAL
  Filled 2014-06-06: qty 1

## 2014-06-06 MED ORDER — OXYCODONE-ACETAMINOPHEN 5-325 MG PO TABS
1.0000 | ORAL_TABLET | ORAL | Status: DC | PRN
  Administered 2014-06-06 – 2014-06-07 (×3): 2 via ORAL
  Filled 2014-06-06 (×3): qty 2

## 2014-06-06 MED ORDER — SENNA 8.6 MG PO TABS
1.0000 | ORAL_TABLET | Freq: Two times a day (BID) | ORAL | Status: DC
  Administered 2014-06-06 (×2): 8.6 mg via ORAL
  Filled 2014-06-06 (×4): qty 1

## 2014-06-06 MED ORDER — SUCCINYLCHOLINE CHLORIDE 20 MG/ML IJ SOLN
INTRAMUSCULAR | Status: AC
  Filled 2014-06-06: qty 1

## 2014-06-06 MED ORDER — MENTHOL 3 MG MT LOZG: 1.0000 | LOZENGE | OROMUCOSAL | Status: DC | PRN

## 2014-06-06 MED ORDER — BUPIVACAINE-EPINEPHRINE (PF) 0.5% -1:200000 IJ SOLN
INTRAMUSCULAR | Status: DC | PRN
  Administered 2014-06-06: 20 mL

## 2014-06-06 MED ORDER — LIDOCAINE HCL (CARDIAC) 20 MG/ML IV SOLN
INTRAVENOUS | Status: DC | PRN
  Administered 2014-06-06: 60 mg via INTRAVENOUS

## 2014-06-06 MED ORDER — PHENYLEPHRINE HCL 10 MG/ML IJ SOLN
INTRAMUSCULAR | Status: DC | PRN
  Administered 2014-06-06 (×2): 40 ug via INTRAVENOUS

## 2014-06-06 MED ORDER — SODIUM CHLORIDE 0.9 % IV SOLN: INTRAVENOUS | Status: DC

## 2014-06-06 MED ORDER — FENTANYL CITRATE 0.05 MG/ML IJ SOLN
INTRAMUSCULAR | Status: DC | PRN
  Administered 2014-06-06 (×3): 50 ug via INTRAVENOUS

## 2014-06-06 MED ORDER — MORPHINE SULFATE 2 MG/ML IJ SOLN: 1.0000 mg | INTRAMUSCULAR | Status: DC | PRN

## 2014-06-06 MED ORDER — NEOSTIGMINE METHYLSULFATE 10 MG/10ML IV SOLN
INTRAVENOUS | Status: DC | PRN
  Administered 2014-06-06: 4 mg via INTRAVENOUS

## 2014-06-06 MED ORDER — GLYCOPYRROLATE 0.2 MG/ML IJ SOLN
INTRAMUSCULAR | Status: DC | PRN
  Administered 2014-06-06: 0.2 mg via INTRAVENOUS
  Administered 2014-06-06: .6 mg via INTRAVENOUS

## 2014-06-06 MED ORDER — SODIUM CHLORIDE 0.9 % IJ SOLN
3.0000 mL | Freq: Two times a day (BID) | INTRAMUSCULAR | Status: DC
  Administered 2014-06-06: 3 mL via INTRAVENOUS

## 2014-06-06 MED ORDER — ARTIFICIAL TEARS OP OINT
TOPICAL_OINTMENT | OPHTHALMIC | Status: DC | PRN
  Administered 2014-06-06: 1 via OPHTHALMIC

## 2014-06-06 MED ORDER — MIDAZOLAM HCL 2 MG/2ML IJ SOLN
INTRAMUSCULAR | Status: AC
  Filled 2014-06-06: qty 2

## 2014-06-06 MED ORDER — ONDANSETRON HCL 4 MG/2ML IJ SOLN
4.0000 mg | INTRAMUSCULAR | Status: DC | PRN
  Administered 2014-06-06: 4 mg via INTRAVENOUS
  Filled 2014-06-06: qty 2

## 2014-06-06 MED ORDER — ROCURONIUM BROMIDE 50 MG/5ML IV SOLN
INTRAVENOUS | Status: AC
  Filled 2014-06-06: qty 1

## 2014-06-06 MED ORDER — VALACYCLOVIR HCL 500 MG PO TABS
500.0000 mg | ORAL_TABLET | Freq: Two times a day (BID) | ORAL | Status: DC | PRN
  Filled 2014-06-06: qty 2

## 2014-06-06 MED ORDER — ONDANSETRON HCL 4 MG/2ML IJ SOLN
INTRAMUSCULAR | Status: AC
  Filled 2014-06-06: qty 2

## 2014-06-06 MED ORDER — ACETAMINOPHEN 650 MG RE SUPP: 650.0000 mg | RECTAL | Status: DC | PRN

## 2014-06-06 MED ORDER — SODIUM CHLORIDE 0.9 % IJ SOLN: 3.0000 mL | INTRAMUSCULAR | Status: DC | PRN

## 2014-06-06 MED ORDER — METHYLPREDNISOLONE ACETATE 80 MG/ML IJ SUSP
INTRAMUSCULAR | Status: DC | PRN
  Administered 2014-06-06: 80 mg

## 2014-06-06 MED ORDER — ACETAMINOPHEN 325 MG PO TABS: 650.0000 mg | ORAL_TABLET | ORAL | Status: DC | PRN

## 2014-06-06 MED ORDER — EPHEDRINE SULFATE 50 MG/ML IJ SOLN
INTRAMUSCULAR | Status: DC | PRN
  Administered 2014-06-06: 10 mg via INTRAVENOUS
  Administered 2014-06-06: 5 mg via INTRAVENOUS
  Administered 2014-06-06: 10 mg via INTRAVENOUS

## 2014-06-06 MED ORDER — FENTANYL CITRATE 0.05 MG/ML IJ SOLN
INTRAMUSCULAR | Status: AC
  Filled 2014-06-06: qty 5

## 2014-06-06 MED ORDER — LACTATED RINGERS IV SOLN
INTRAVENOUS | Status: DC | PRN
  Administered 2014-06-06 (×3): via INTRAVENOUS

## 2014-06-06 MED ORDER — CEFAZOLIN SODIUM 1-5 GM-% IV SOLN
1.0000 g | Freq: Three times a day (TID) | INTRAVENOUS | Status: AC
  Administered 2014-06-06 – 2014-06-07 (×2): 1 g via INTRAVENOUS
  Filled 2014-06-06 (×2): qty 50

## 2014-06-06 SURGICAL SUPPLY — 62 items
APL SKNCLS STERI-STRIP NONHPOA (GAUZE/BANDAGES/DRESSINGS) ×1
BENZOIN TINCTURE PRP APPL 2/3 (GAUZE/BANDAGES/DRESSINGS) ×3 IMPLANT
BLADE 10 SAFETY STRL DISP (BLADE) ×1 IMPLANT
BLADE SURG ROTATE 9660 (MISCELLANEOUS) IMPLANT
BUR ACORN 6.0 (BURR) ×2 IMPLANT
BUR ACORN 6.0MM (BURR) ×1
BUR MATCHSTICK NEURO 3.0 LAGG (BURR) IMPLANT
CANISTER SUCT 3000ML (MISCELLANEOUS) ×3 IMPLANT
CLOSURE WOUND 1/2 X4 (GAUZE/BANDAGES/DRESSINGS) ×1
CONT SPEC 4OZ CLIKSEAL STRL BL (MISCELLANEOUS) ×3 IMPLANT
DRAPE C-ARM 42X72 X-RAY (DRAPES) ×4 IMPLANT
DRAPE LAPAROTOMY 100X72X124 (DRAPES) ×3 IMPLANT
DRAPE MICROSCOPE LEICA (MISCELLANEOUS) ×3 IMPLANT
DRAPE POUCH INSTRU U-SHP 10X18 (DRAPES) ×3 IMPLANT
DRSG OPSITE POSTOP 3X4 (GAUZE/BANDAGES/DRESSINGS) ×2 IMPLANT
DURAPREP 26ML APPLICATOR (WOUND CARE) ×3 IMPLANT
ELECT REM PT RETURN 9FT ADLT (ELECTROSURGICAL) ×3
ELECTRODE REM PT RTRN 9FT ADLT (ELECTROSURGICAL) ×1 IMPLANT
GAUZE SPONGE 4X4 16PLY XRAY LF (GAUZE/BANDAGES/DRESSINGS) IMPLANT
GLOVE BIO SURGEON STRL SZ8 (GLOVE) ×2 IMPLANT
GLOVE BIOGEL M 8.0 STRL (GLOVE) ×5 IMPLANT
GLOVE BIOGEL PI IND STRL 8.5 (GLOVE) IMPLANT
GLOVE BIOGEL PI INDICATOR 8.5 (GLOVE) ×2
GLOVE EXAM NITRILE LRG STRL (GLOVE) IMPLANT
GLOVE EXAM NITRILE MD LF STRL (GLOVE) IMPLANT
GLOVE EXAM NITRILE XL STR (GLOVE) IMPLANT
GLOVE EXAM NITRILE XS STR PU (GLOVE) IMPLANT
GOWN STRL REUS W/ TWL LRG LVL3 (GOWN DISPOSABLE) ×1 IMPLANT
GOWN STRL REUS W/ TWL XL LVL3 (GOWN DISPOSABLE) IMPLANT
GOWN STRL REUS W/TWL 2XL LVL3 (GOWN DISPOSABLE) IMPLANT
GOWN STRL REUS W/TWL LRG LVL3 (GOWN DISPOSABLE) ×3
GOWN STRL REUS W/TWL XL LVL3 (GOWN DISPOSABLE) ×3
KIT BASIN OR (CUSTOM PROCEDURE TRAY) ×3 IMPLANT
KIT ROOM TURNOVER OR (KITS) ×3 IMPLANT
NDL HYPO 18GX1.5 BLUNT FILL (NEEDLE) IMPLANT
NDL HYPO 21X1.5 SAFETY (NEEDLE) IMPLANT
NDL HYPO 25X1 1.5 SAFETY (NEEDLE) IMPLANT
NDL SPNL 20GX3.5 QUINCKE YW (NEEDLE) IMPLANT
NEEDLE HYPO 18GX1.5 BLUNT FILL (NEEDLE) ×3 IMPLANT
NEEDLE HYPO 21X1.5 SAFETY (NEEDLE) IMPLANT
NEEDLE HYPO 25X1 1.5 SAFETY (NEEDLE) IMPLANT
NEEDLE SPNL 20GX3.5 QUINCKE YW (NEEDLE) IMPLANT
NS IRRIG 1000ML POUR BTL (IV SOLUTION) ×3 IMPLANT
PACK LAMINECTOMY NEURO (CUSTOM PROCEDURE TRAY) ×3 IMPLANT
PAD ABD 8X10 STRL (GAUZE/BANDAGES/DRESSINGS) IMPLANT
PAD ARMBOARD 7.5X6 YLW CONV (MISCELLANEOUS) ×9 IMPLANT
PATTIES SURGICAL .5 X1 (DISPOSABLE) ×1 IMPLANT
RUBBERBAND STERILE (MISCELLANEOUS) ×6 IMPLANT
SPONGE GAUZE 4X4 12PLY (GAUZE/BANDAGES/DRESSINGS) ×3 IMPLANT
SPONGE LAP 4X18 X RAY DECT (DISPOSABLE) IMPLANT
SPONGE SURGIFOAM ABS GEL SZ50 (HEMOSTASIS) ×3 IMPLANT
STRIP CLOSURE SKIN 1/2X4 (GAUZE/BANDAGES/DRESSINGS) ×2 IMPLANT
SUT VIC AB 0 CT1 18XCR BRD8 (SUTURE) ×1 IMPLANT
SUT VIC AB 0 CT1 8-18 (SUTURE) ×3
SUT VIC AB 2-0 CP2 18 (SUTURE) ×3 IMPLANT
SUT VIC AB 3-0 SH 8-18 (SUTURE) ×3 IMPLANT
SYR 20CC LL (SYRINGE) IMPLANT
SYR 20ML ECCENTRIC (SYRINGE) ×3 IMPLANT
SYR 5ML LL (SYRINGE) ×2 IMPLANT
TOWEL OR 17X24 6PK STRL BLUE (TOWEL DISPOSABLE) ×3 IMPLANT
TOWEL OR 17X26 10 PK STRL BLUE (TOWEL DISPOSABLE) ×3 IMPLANT
WATER STERILE IRR 1000ML POUR (IV SOLUTION) ×3 IMPLANT

## 2014-06-06 NOTE — Anesthesia Preprocedure Evaluation (Addendum)
Anesthesia Evaluation  Patient identified by MRN, date of birth, ID band Patient awake    Reviewed: Allergy & Precautions, H&P , NPO status , Patient's Chart, lab work & pertinent test results  Airway Mallampati: II      Dental  (+) Teeth Intact, Dental Advisory Given   Pulmonary shortness of breath,  breath sounds clear to auscultation        Cardiovascular hypertension, Rhythm:Regular Rate:Normal     Neuro/Psych    GI/Hepatic Neg liver ROS, GERD-  ,  Endo/Other  negative endocrine ROS  Renal/GU Renal disease     Musculoskeletal   Abdominal   Peds  Hematology   Anesthesia Other Findings   Reproductive/Obstetrics                          Anesthesia Physical Anesthesia Plan  ASA: III  Anesthesia Plan: General   Post-op Pain Management:    Induction: Intravenous  Airway Management Planned: Oral ETT  Additional Equipment:   Intra-op Plan:   Post-operative Plan: Extubation in OR  Informed Consent: I have reviewed the patients History and Physical, chart, labs and discussed the procedure including the risks, benefits and alternatives for the proposed anesthesia with the patient or authorized representative who has indicated his/her understanding and acceptance.   Dental advisory given  Plan Discussed with: CRNA and Anesthesiologist  Anesthesia Plan Comments:         Anesthesia Quick Evaluation

## 2014-06-06 NOTE — Anesthesia Postprocedure Evaluation (Signed)
  Anesthesia Post-op Note  Patient: Briana Wolf  Procedure(s) Performed: Procedure(s): LUMBAR LAMINECTOMY/DECOMPRESSION MICRODISCECTOMY 1 LEVEL LEFT LUMBAR THREE-FOUR (Left)  Patient Location: PACU  Anesthesia Type:General  Level of Consciousness: awake  Airway and Oxygen Therapy: Patient Spontanous Breathing  Post-op Pain: mild  Post-op Assessment: Post-op Vital signs reviewed  Post-op Vital Signs: Reviewed  Last Vitals:  Filed Vitals:   06/06/14 0814  BP: 152/80  Pulse: 70  Temp: 36.7 C  Resp: 18    Complications: No apparent anesthesia complications

## 2014-06-06 NOTE — Plan of Care (Signed)
Problem: Consults Goal: Diagnosis - Spinal Surgery Outcome: Completed/Met Date Met:  06/06/14 Lumbar Laminectomy (Complex)

## 2014-06-06 NOTE — H&P (Signed)
Briana Wolf is an 63 y.o. female.   Chief Complaint: lumbar pain HPI: patient complaining of lumbar pain with radiation to the left leg for 8 weeks associated with weakness and sensory changes. No better with conservative treatment.  Past Medical History  Diagnosis Date  . Hyperlipidemia   . Hypertension   . GERD (gastroesophageal reflux disease)   . Arthritis   . Family history of anesthesia complication     mom allergic-to many meds-multiple medical problems  . Contact lens/glasses fitting     wears contacts or glasses at times  . Tear of right rotator cuff 08/05/2013  . Renal insufficiency     Dr Brayton El in [facility]  . Coughing     gets cough from dry throat  . Depression     Past Surgical History  Procedure Laterality Date  . Rotator cuff repair  1997    let shoulder   . Carpal tunnel release  1987    left and right wrist   . Foot surgery  2005    bilateral   . Back surgery  2008    cervical fusion   . Back surgery  1998    lumb disk  . Tonsillectomy    . Dilation and curettage of uterus  2006  . Colonoscopy    . Elbow arthroscopy      ulnar nerve-rt  . Shoulder arthroscopy with rotator cuff repair and subacromial decompression Right 08/05/2013    Procedure: RIGHT SHOULDER ARTHROSCOPY WITH EXTENSIVE DEBRIDEMENT, ROTATOR CUFF REPAIR AND SUBACROMIAL DECOMPRESSION;  Surgeon: Johnny Bridge, MD;  Location: [place];  Service: Orthopedics;  Laterality: Right;  . Shoulder arthroscopy with rotator cuff repair Right 01/20/2014    Procedure: RIGHT SHOULDER ARTHROSCOPY WITH EXTENSIVE  DEBRIDEMENT, REVISION  ROTATOR CUFF REPAIR AND BICEPS TENOTOMY;  Surgeon: Johnny Bridge, MD;  Location: [place];  Service: Orthopedics;  Laterality: Right;  . Tubal ligation  1989    Family History  Problem Relation Age of Onset  . Diabetes Mother   . Heart disease Father    Social History:  reports that she has never smoked. She has never used  smokeless tobacco. She reports that she drinks alcohol. She reports that she does not use illicit drugs.  Allergies:  Allergies  Allergen Reactions  . Codeine Nausea Only    Makes pt nauseous   . Nsaids     Decreasing GFR, told to not take NSAIDS again    Medications Prior to Admission  Medication Sig Dispense Refill  . acetaminophen (TYLENOL) 325 MG tablet Take 325 mg by mouth every 6 (six) hours as needed.      Marland Kitchen buPROPion (WELLBUTRIN XL) 150 MG 24 hr tablet Take 450 mg by mouth every morning.      Marland Kitchen Dexlansoprazole (DEXILANT) 30 MG capsule Take 30 mg by mouth every morning.      . loratadine (CLARITIN) 10 MG tablet Take 10 mg by mouth daily as needed for allergies.      . methocarbamol (ROBAXIN) 500 MG tablet Take 500 mg by mouth at bedtime.      Marland Kitchen omega-3 acid ethyl esters (LOVAZA) 1 G capsule Take 2 g by mouth every morning.      . traMADol (ULTRAM) 50 MG tablet Take 50 mg by mouth every 6 (six) hours as needed. For pain      . traZODone (DESYREL) 50 MG tablet Take 50 mg by mouth at bedtime.      Marland Kitchen  triamterene-hydrochlorothiazide (MAXZIDE-25) 37.5-25 MG per tablet Take 1 tablet by mouth every morning.       . valACYclovir (VALTREX) 500 MG tablet Take 500-1,000 mg by mouth 2 (two) times daily as needed (for cold sores). At onset of cold sore take 1000 mg twice daily for 1 day, then take 500 mg twice daily for 2 days        No results found for this or any previous visit (from the past 48 hour(s)). No results found.  Review of Systems  Constitutional: Negative.   HENT: Negative.   Eyes: Negative.   Respiratory: Negative.   Cardiovascular: Negative.   Musculoskeletal: Positive for back pain.  Skin: Negative.   Neurological: Positive for sensory change and focal weakness.  Endo/Heme/Allergies: Negative.   Psychiatric/Behavioral: Negative.     Blood pressure 152/80, pulse 70, temperature 98.1 F (36.7 C), temperature source Oral, resp. rate 18, height 5\' 1"  (1.549 m), weight  149 lb (67.586 kg), SpO2 97.00%. Physical Exam hent, nl. Neck, anterior scar. Cv, nl. Lungs,clear. Abdomen, soft. Extremities, nl NEURO FEMORAL stretch is positive in the left.decrease of pin prick  At l2 dermatome.radiological studies shows ddd at l3-4 with osteophyte compromising the l3 nerve root. extraforaminally              Assessment/Plandecompression of l3-4 nerve root with a extraforaminally approach. Patient know about risks and benefits   BOTERO,ERNESTO M 06/06/2014, 9:42 AM

## 2014-06-06 NOTE — Transfer of Care (Signed)
Immediate Anesthesia Transfer of Care Note  Patient: Briana Wolf  Procedure(s) Performed: Procedure(s): LUMBAR LAMINECTOMY/DECOMPRESSION MICRODISCECTOMY 1 LEVEL LEFT LUMBAR THREE-FOUR (Left)  Patient Location: PACU  Anesthesia Type:General  Level of Consciousness: awake and alert   Airway & Oxygen Therapy: Patient Spontanous Breathing and Patient connected to nasal cannula oxygen  Post-op Assessment: Report given to PACU RN and Post -op Vital signs reviewed and stable  Post vital signs: Reviewed and stable  Complications: No apparent anesthesia complications

## 2014-06-07 MED ORDER — HYDROXYZINE HCL 25 MG PO TABS
25.0000 mg | ORAL_TABLET | ORAL | Status: DC | PRN
  Administered 2014-06-07: 25 mg via ORAL
  Filled 2014-06-07: qty 1

## 2014-06-07 NOTE — Progress Notes (Signed)
Pt. Alert and oriented,follows simple instructions, denies pain. Incision area without swelling, redness or S/S of infection. Voiding adequate clear yellow urine. Moving all extremities well and vitals stable and documented. Patient discharged home with family.  Lumbar surgery notes instructions given to patient and family member for home safety and precautions. Pt. and family stated understanding of instructions given 

## 2014-06-07 NOTE — Op Note (Signed)
[name], [name] NO.:  [id number]  MEDICAL RECORD NO.:  [id number]  LOCATION:  3C07C                        FACILITY:  [facility]  PHYSICIAN:  Leeroy Cha, M.D.   DATE OF BIRTH:  April 06, 1951  DATE OF PROCEDURE:  06/06/2014 DATE OF DISCHARGE:                              OPERATIVE REPORT   PREOPERATIVE DIAGNOSIS:  Left L3-L4 herniated disk osteophyte, status post lumbar laminectomy for disk.  POSTOPERATIVE DIAGNOSIS:  Left L3-L4 herniated disk osteophyte, status post lumbar laminectomy for disk.  PROCEDURE:  Left L3-L4 extraforaminal diskectomy, decompression of the L3 nerve root, lysis of adhesions, removal of a large osteophyte, microscope.  SURGEON:  Leeroy Cha, M.D.  ASSISTANT:  Marchia Meiers. Vertell Limber, M.D.  CLINICAL HISTORY:  The patient is complaining of back pain _with radiation_________ all the way down to the knee associated with weakness and numbness.  X- rays showed multiple level of degenerative disk disease, but at the of L3-4, there was a large osteophyte with a possibility of disk compromising the L2 nerve root.  Surgery was advised.  The procedure was going to be through an extraforaminal approach.  She knew the risk and benefits of the surgery.  DESCRIPTION OF PROCEDURE:  The patient was taken to the OR, and after intubation, she was positioned in a prone manner.  The back was cleaned with DuraPrep.  Drapes were applied.  With the help of the C-arm first in AP view and then a lateral view, we selected the area between transverse process L3-4 in the left side.  An incision about 2 inches was made through the skin, subcutaneous tissue, through the muscle all the way down to the medial aspect of the transverse process L3-4.  We brought the microscope into the area.  We removed part of the transverse process above and part of the lateral aspect of the facet.  Indeed, we found that the L3 nerve root was absolutely glued to the area.  There was  a large osteophyte with some soft component.  Lysis was accomplished and we were able to decompress the L3 nerve root not only using the _kerrinson punches_________ but also the drill.  Then, the patient had quite a bit of adhesion proximal at the level of L3 and decompression was done.  At the end, we had plenty of room for the L3 nerve root.  The area was irrigated.  Hemostasis was done.  Fentanyl and Depo-Medrol were left in the epidural space and the wound was closed with Vicryl and Steri- Strips.          ______________________________ Leeroy Cha, M.D.     EB/MEDQ  D:  06/06/2014  T:  06/06/2014  Job:  [id number]

## 2014-06-07 NOTE — Evaluation (Signed)
Occupational Therapy Evaluation Patient Details Name: Briana Wolf MRN: [id number] DOB: [birth year] Today's Date: 06/07/2014    History of Present Illness Pt admitted for L3-4 lami. Hx of Right RCR repair x 2   Clinical Impression   Pt educated in back precautions related to ADL and IADL.  Instructed in use of AE for LB ADL. Pt is not able to access her L foot for bathing and dressing, will rely on her husband.  Instructed to dress L LE first, undress last.  No further OT needs.    Follow Up Recommendations  No OT follow up    Equipment Recommendations  None recommended by OT    Recommendations for Other Services       Precautions / Restrictions Precautions Precautions: Back Precaution Booklet Issued: Yes (comment) Precaution Comments: reviewed back precautions related to ADL and IADL      Mobility Bed Mobility Overal bed mobility: Needs Assistance Bed Mobility: Rolling;Sidelying to Sit Rolling: Supervision Sidelying to sit: Supervision       General bed mobility comments: pt able to return demo after cues given  Transfers Overall transfer level: Modified independent                    Balance                                            ADL Overall ADL's : Needs assistance/impaired Eating/Feeding: Independent   Grooming: Modified independent   Upper Body Bathing: Independent   Lower Body Bathing: Modified independent Lower Body Bathing Details (indicate cue type and reason): recommended bath sponge on handle Upper Body Dressing : Independent   Lower Body Dressing: Minimal assistance Lower Body Dressing Details (indicate cue type and reason): pt declining, but is aware of sock aide       Toileting - Clothing Manipulation Details (indicate cue type and reason): instructed in availability of toilet aide for pericare avoiding twisting     Functional mobility during ADLs: Modified independent General ADL Comments: Reviewed  lifting precautions and use of reacher.     Vision                     Perception     Praxis      Pertinent Vitals/Pain Did not c/o pain, premedicated     Hand Dominance Right   Extremity/Trunk Assessment Upper Extremity Assessment Upper Extremity Assessment: Overall WFL for tasks assessed (recent R shoulder surgery, recently discharged from therapy)   Lower Extremity Assessment Lower Extremity Assessment: Defer to PT evaluation   Cervical / Trunk Assessment Cervical / Trunk Assessment: Normal   Communication Communication Communication: No difficulties   Cognition Arousal/Alertness: Awake/alert Behavior During Therapy: WFL for tasks assessed/performed Overall Cognitive Status: Within Functional Limits for tasks assessed                     General Comments       Exercises       Shoulder Instructions      Home Living Family/patient expects to be discharged to:: Private residence Living Arrangements: Spouse/significant other Available Help at Discharge: Family;Available 24 hours/day Type of Home: House       Home Layout: Two level Alternate Level Stairs-Number of Steps: flight Alternate Level Stairs-Rails: Right     Bathroom Toilet: Standard     Home Equipment: None  Prior Functioning/Environment Level of Independence: Independent             OT Diagnosis:     OT Problem List:     OT Treatment/Interventions:      OT Goals(Current goals can be found in the care plan section)    OT Frequency:     Barriers to D/C:            Co-evaluation              End of Session    Activity Tolerance: Patient tolerated treatment well Patient left: in chair;with call bell/phone within reach   Time: 8250-0370 OT Time Calculation (min): 20 min Charges:  OT General Charges $OT Visit: 1 Procedure OT Evaluation $Initial OT Evaluation Tier I: 1 Procedure OT Treatments $Self Care/Home Management : 8-22  mins G-Codes:    Malka So 06/07/2014, 9:21 AM [phone]

## 2014-06-07 NOTE — Discharge Instructions (Signed)
Wound Care °Leave incision open to air. °You may shower. °Do not scrub directly on incision.  °Leave steri-strips on incision.  They will fall off by themselves. °Do not put any creams, lotions, or ointments on incision. °Activity °Walk each and every day, increasing distance each day. °No lifting greater than 5 lbs.  Avoid bending, arching, and twisting. °No driving for 2 weeks; may ride as a passenger locally. °If provided with back brace, wear when out of bed.  It is not necessary to wear in bed. °Diet °Resume your normal diet.  °Return to Work °Will be discussed at you follow up appointment. °Call Your Doctor If Any of These Occur °Redness, drainage, or swelling at the wound.  °Temperature greater than 101 degrees. °Severe pain not relieved by pain medication. °Incision starts to come apart. °Follow Up Appt °Call today for appointment in 3-4 weeks ([phone]) or for problems.  If you have any hardware placed in your spine, you will need an x-ray before your appointment. ° °

## 2014-06-07 NOTE — Discharge Summary (Signed)
Physician Discharge Summary  Patient ID: Briana Wolf MRN: [id number] DOB/AGE: January 11, 1951 63 y.o.  Admit date: 06/06/2014 Discharge date: 06/07/2014  Admission Diagnoses:left l3-4 extraforaminal hnp  Discharge Diagnoses:  Active Problems:   Lumbar foraminal stenosis   Discharged Condition:less pain than preop  Hospital Course: surgery  Consults: none  Significant Diagnostic Studies: myelogram  Treatments:lumbar discectomy  Discharge Exam: Blood pressure 105/63, pulse 66, temperature 98.5 F (36.9 C), temperature source Oral, resp. rate 18, height 5\' 1"  (1.549 m), weight 149 lb (67.586 kg), SpO2 96.00%. No weakness  Disposition: 01-Home or Self Care     Medication List    ASK your doctor about these medications       acetaminophen 325 MG tablet  Commonly known as:  TYLENOL  Take 325 mg by mouth every 6 (six) hours as needed.     buPROPion 150 MG 24 hr tablet  Commonly known as:  WELLBUTRIN XL  Take 450 mg by mouth every morning.     DEXILANT 30 MG capsule  Generic drug:  Dexlansoprazole  Take 30 mg by mouth every morning.     loratadine 10 MG tablet  Commonly known as:  CLARITIN  Take 10 mg by mouth daily as needed for allergies.     methocarbamol 500 MG tablet  Commonly known as:  ROBAXIN  Take 500 mg by mouth at bedtime.     omega-3 acid ethyl esters 1 G capsule  Commonly known as:  LOVAZA  Take 2 g by mouth every morning.     traMADol 50 MG tablet  Commonly known as:  ULTRAM  Take 50 mg by mouth every 6 (six) hours as needed. For pain     traZODone 50 MG tablet  Commonly known as:  DESYREL  Take 50 mg by mouth at bedtime.     triamterene-hydrochlorothiazide 37.5-25 MG per tablet  Commonly known as:  MAXZIDE-25  Take 1 tablet by mouth every morning.     valACYclovir 500 MG tablet  Commonly known as:  VALTREX  Take 500-1,000 mg by mouth 2 (two) times daily as needed (for cold sores). At onset of cold sore take 1000 mg twice daily for 1 day,  then take 500 mg twice daily for 2 days         Signed: Floyce Stakes 06/07/2014, 10:45 AM

## 2014-06-07 NOTE — Evaluation (Signed)
Physical Therapy Evaluation/ Discharge Patient Details Name: Briana Wolf MRN: [id number] DOB: 1951/10/02 Today's Date: 06/07/2014   History of Present Illness  Pt admitted for L3-4 lami. 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 Pt able to perform bed mobility even rolling to right (side of RCR) as well as stairs. Educated for home management with multiple pets and limited time sitting given plan for travel to the beach in August. All education completed, questions answered and handout provided. No further acute therapy needs with pt encouraged to ambulate within pain tolerance.     Follow Up Recommendations No PT follow up    Equipment Recommendations  None recommended by PT    Recommendations for Other Services       Precautions / Restrictions Precautions Precautions: Back Precaution Booklet Issued: Yes (comment)      Mobility  Bed Mobility Overal bed mobility: Needs Assistance Bed Mobility: Rolling;Sidelying to Sit Rolling: Supervision Sidelying to sit: Supervision       General bed mobility comments: pt able to return demo after cues given  Transfers Overall transfer level: Modified independent                  Ambulation/Gait Ambulation/Gait assistance: Modified independent (Device/Increase time) Ambulation Distance (Feet): 400 Feet Assistive device: None Gait Pattern/deviations: WFL(Within Functional Limits)   Gait velocity interpretation: at or above normal speed for age/gender    Stairs Stairs: Yes Stairs assistance: Modified independent (Device/Increase time) Stair Management: One rail Right;Step to pattern Number of Stairs: 10 General stair comments: cues for sequence and pattern to decrease pain  Wheelchair Mobility    Modified Rankin (Stroke Patients Only)       Balance                                              Pertinent Vitals/Pain 5/10 back pain at incisional site, repositioned and RN aware    Home Living Family/patient expects to be discharged to:: Private residence Living Arrangements: Spouse/significant other Available Help at Discharge: Family;Available 24 hours/day Type of Home: House       Home Layout: Two level Home Equipment: None      Prior Function Level of Independence: Independent               Hand Dominance        Extremity/Trunk Assessment   Upper Extremity Assessment: Overall WFL for tasks assessed           Lower Extremity Assessment: Overall WFL for tasks assessed (pt with reports of left hip and buttock pain but not impairing basic mobility)      Cervical / Trunk Assessment: Normal  Communication   Communication: No difficulties  Cognition Arousal/Alertness: Awake/alert Behavior During Therapy: WFL for tasks assessed/performed Overall Cognitive Status: Within Functional Limits for tasks assessed                      General Comments      Exercises        Assessment/Plan    PT Assessment Patent does not need any further PT services  PT Diagnosis     PT Problem List    PT Treatment Interventions     PT Goals (Current goals can be  found in the Care Plan section) Acute Rehab PT Goals PT Goal Formulation: No goals set, d/c therapy    Frequency     Barriers to discharge        Co-evaluation               End of Session   Activity Tolerance: Patient tolerated treatment well Patient left: in chair;with call bell/phone within reach;with nursing/sitter in room Nurse Communication: Mobility status;Precautions         Time: 5361-4431 PT Time Calculation (min): 33 min   Charges:   PT Evaluation $Initial PT Evaluation Tier I: 1 Procedure PT Treatments $Therapeutic Activity: 23-37 mins   PT G Codes:          Melford Aase 06/07/2014, 8:26 AM Elwyn Reach, [place]

## 2014-06-11 ENCOUNTER — Encounter (HOSPITAL_COMMUNITY): Payer: Self-pay | Admitting: Neurosurgery

## 2014-08-11 ENCOUNTER — Other Ambulatory Visit: Payer: Self-pay | Admitting: Family Medicine

## 2014-08-11 DIAGNOSIS — R748 Abnormal levels of other serum enzymes: Secondary | ICD-10-CM

## 2014-08-18 ENCOUNTER — Ambulatory Visit
Admission: RE | Admit: 2014-08-18 | Discharge: 2014-08-18 | Disposition: A | Discharge status: Home / Self Care | Payer: 59 | Source: Ambulatory Visit | Attending: Family Medicine | Admitting: Family Medicine

## 2014-08-18 DIAGNOSIS — R748 Abnormal levels of other serum enzymes: Secondary | ICD-10-CM

## 2014-08-23 ENCOUNTER — Other Ambulatory Visit: Payer: Self-pay | Admitting: Family Medicine

## 2014-08-23 DIAGNOSIS — R932 Abnormal findings on diagnostic imaging of liver and biliary tract: Secondary | ICD-10-CM

## 2014-08-30 ENCOUNTER — Ambulatory Visit
Admission: RE | Admit: 2014-08-30 | Discharge: 2014-08-30 | Disposition: A | Discharge status: Home / Self Care | Payer: 59 | Source: Ambulatory Visit | Attending: Family Medicine | Admitting: Family Medicine

## 2014-08-30 DIAGNOSIS — R932 Abnormal findings on diagnostic imaging of liver and biliary tract: Secondary | ICD-10-CM

## 2014-08-30 MED ORDER — GADOBENATE DIMEGLUMINE 529 MG/ML IV SOLN
8.0000 mL | Freq: Once | INTRAVENOUS | Status: AC | PRN
  Administered 2014-08-30: 8 mL via INTRAVENOUS

## 2014-11-02 ENCOUNTER — Other Ambulatory Visit: Payer: Self-pay | Admitting: Gastroenterology

## 2014-11-02 DIAGNOSIS — R1013 Epigastric pain: Secondary | ICD-10-CM

## 2014-11-07 ENCOUNTER — Ambulatory Visit
Admission: RE | Admit: 2014-11-07 | Discharge: 2014-11-07 | Disposition: A | Discharge status: Home / Self Care | Payer: 59 | Source: Ambulatory Visit | Attending: Gastroenterology | Admitting: Gastroenterology

## 2014-11-07 DIAGNOSIS — R1013 Epigastric pain: Secondary | ICD-10-CM

## 2015-04-27 ENCOUNTER — Other Ambulatory Visit: Payer: Self-pay | Admitting: Neurosurgery

## 2015-04-27 DIAGNOSIS — M5416 Radiculopathy, lumbar region: Secondary | ICD-10-CM

## 2015-05-08 ENCOUNTER — Ambulatory Visit
Admission: RE | Admit: 2015-05-08 | Discharge: 2015-05-08 | Disposition: A | Discharge status: Home / Self Care | Payer: 59 | Source: Ambulatory Visit | Attending: Neurosurgery | Admitting: Neurosurgery

## 2015-05-08 VITALS — BP 117/65 | HR 58

## 2015-05-08 DIAGNOSIS — M5416 Radiculopathy, lumbar region: Secondary | ICD-10-CM

## 2015-05-08 DIAGNOSIS — M48061 Spinal stenosis, lumbar region without neurogenic claudication: Secondary | ICD-10-CM

## 2015-05-08 MED ORDER — IOHEXOL 180 MG/ML  SOLN
1.0000 mL | Freq: Once | INTRAMUSCULAR | Status: AC | PRN
  Administered 2015-05-08: 1 mL via INTRATHECAL

## 2015-05-08 MED ORDER — DIAZEPAM 5 MG PO TABS
5.0000 mg | ORAL_TABLET | Freq: Once | ORAL | Status: AC
  Administered 2015-05-08: 5 mg via ORAL

## 2015-05-08 NOTE — Discharge Instructions (Signed)
Myelogram Discharge Instructions  1. Go home and rest quietly for the next 24 hours.  It is important to lie flat for the next 24 hours.  Get up only to go to the restroom.  You may lie in the bed or on a couch on your back, your stomach, your left side or your right side.  You may have one pillow under your head.  You may have pillows between your knees while you are on your side or under your knees while you are on your back.  2. DO NOT drive today.  Recline the seat as far back as it will go, while still wearing your seat belt, on the way home.  3. You may get up to go to the bathroom as needed.  You may sit up for 10 minutes to eat.  You may resume your normal diet and medications unless otherwise indicated.  Drink lots of extra fluids today and tomorrow.  4. The incidence of headache, nausea, or vomiting is about 5% (one in 20 patients).  If you develop a headache, lie flat and drink plenty of fluids until the headache goes away.  Caffeinated beverages may be helpful.  If you develop severe nausea and vomiting or a headache that does not go away with flat bed rest, call [phone].  5. You may resume normal activities after your 24 hours of bed rest is over; however, do not exert yourself strongly or do any heavy lifting tomorrow. If when you get up you have a headache when standing, go back to bed and force fluids for another 24 hours.  6. Call your physician for a follow-up appointment.  The results of your myelogram will be sent directly to your physician by the following day.  7. If you have any questions or if complications develop after you arrive home, please call [phone].  Discharge instructions have been explained to the patient.  The patient, or the person responsible for the patient, fully understands these instructions.       May resume Wellbutrin, Tramadol, Trazodone and Prozac on May 09, 2015, after 1:00 pm.

## 2015-05-08 NOTE — Progress Notes (Signed)
Pt states she has been off Wellbutrin, Prozac, Tramadol and Trazodone for the past 2 days. Discharge instructions explained to pt.

## 2015-05-09 ENCOUNTER — Telehealth: Payer: Self-pay | Admitting: Cardiovascular Disease

## 2015-05-09 NOTE — Telephone Encounter (Signed)
New Prob    Pt states she had a NT-PROBNP test and would like to discuss her results with nurse prior to follow up in June with Dr. Acie Fredrickson. Please call.

## 2015-05-09 NOTE — Telephone Encounter (Signed)
Spoke with patient and states she had lab work recently for [employer] and she is concerned about BNP above normal range.  Patient reports her result was in the 130's and normal range is reported 0-120.  I advised patient that this is just slightly above normal and not to be alarmed.  She states her cholesterol result was also 245.  I advised her to bring copies to upcoming appointment with Dr. Acie Fredrickson for follow-up.  Patient verbalized understanding and agreement.

## 2015-06-13 ENCOUNTER — Ambulatory Visit (INDEPENDENT_AMBULATORY_CARE_PROVIDER_SITE_OTHER): Payer: 59 | Admitting: Cardiovascular Disease

## 2015-06-13 ENCOUNTER — Encounter: Payer: Self-pay | Admitting: Cardiovascular Disease

## 2015-06-13 VITALS — BP 138/70 | HR 71 | Ht 61.0 in | Wt 153.0 lb

## 2015-06-13 DIAGNOSIS — E785 Hyperlipidemia, unspecified: Secondary | ICD-10-CM

## 2015-06-13 NOTE — Patient Instructions (Signed)
Medication Instructions:  Your physician recommends that you continue on your current medications as directed. Please refer to the Current Medication list given to you today.   Labwork: None Ordered   Testing/Procedures: None Ordered   Follow-Up: Your physician recommends that you schedule a follow-up appointment in: as needed with Dr. Nahser     

## 2015-06-13 NOTE — Progress Notes (Signed)
Briana Wolf [id number] Date of Birth  08/04/1951       [facility] Office [address], [place], [place]  [address]   [place], [place]  [address] [phone]     [phone]   Fax  [phone]    Fax [phone]  Problem List: 1. Chest pain 2. Hyperlipidemia 3. CKD - stage 3 4. Hypertension  History of Present Illness:  Briana Wolf is a 64 yo with a strong family history of CAD (father with MI, mother has a pacer, sister has coronary athersclerosis).  She has occasional CP ( upper chest in the throat area).   She has some dyspnea with exercise which is her main concern today.  She walks her dog  on occasion.  She has not had any further episodes of dyspnea.  She was previously on atorvastatin but does not remember the dose. Atorvastatin was apparently discontinued when her liver enzymes were elevated.  Her most recent cholesterol level reveals an LDL of 133.  June 13, 2015:   Briana Wolf is seen back today after a 3 year absence.  Her husband, Dominica Severin , now has been diagnosed with dementia.   She is working 2 part time jobs.  Has some vague chest pains on rare occasion.  Not related to exertion.  Possibly related to GERD. She does not get regular exercise but is very busy.    She recently had a life insurance physical performed. She was found have a mildly elevated  BNP.  She was also found to have mild hyperlipidemia .   She has no symptoms . No dyspnea  She had a normal stress echocardiogram in 2013 showed normal left ventricle systolic function at that time.   Current Outpatient Prescriptions on File Prior to Visit  Medication Sig Dispense Refill  . buPROPion (WELLBUTRIN XL) 150 MG 24 hr tablet Take 450 mg by mouth every morning.    Marland Kitchen omega-3 acid ethyl esters (LOVAZA) 1 G capsule Take 2 g by mouth every morning.    . triamterene-hydrochlorothiazide (MAXZIDE-25) 37.5-25 MG per tablet Take 1 tablet by mouth every morning.      . valACYclovir (VALTREX) 500 MG tablet Take 500-1,000 mg by mouth 2 (two) times daily as needed (for cold sores). At onset of cold sore take 1000 mg twice daily for 1 day, then take 500 mg twice daily for 2 days     No current facility-administered medications on file prior to visit.    Allergies  Allergen Reactions  . Codeine Nausea Only    Makes pt nauseous   . Nsaids     Decreasing GFR, told to not take NSAIDS again    Past Medical History  Diagnosis Date  . Hyperlipidemia   . Hypertension   . GERD (gastroesophageal reflux disease)   . Arthritis   . Family history of anesthesia complication     mom allergic-to many meds-multiple medical problems  . Contact lens/glasses fitting     wears contacts or glasses at times  . Tear of right rotator cuff 08/05/2013  . Renal insufficiency     Dr Brayton El in [facility]  . Coughing     gets cough from dry throat  . Depression     Past Surgical History  Procedure Laterality Date  . Rotator cuff repair  1997    let shoulder   . Carpal tunnel release  1987    left and right  wrist   . Foot surgery  2005    bilateral   . Back surgery  2008    cervical fusion   . Back surgery  1998    lumb disk  . Tonsillectomy    . Dilation and curettage of uterus  2006  . Colonoscopy    . Elbow arthroscopy      ulnar nerve-rt  . Shoulder arthroscopy with rotator cuff repair and subacromial decompression Right 08/05/2013    Procedure: RIGHT SHOULDER ARTHROSCOPY WITH EXTENSIVE DEBRIDEMENT, ROTATOR CUFF REPAIR AND SUBACROMIAL DECOMPRESSION;  Surgeon: Johnny Bridge, MD;  Location: [facility];  Service: Orthopedics;  Laterality: Right;  . Shoulder arthroscopy with rotator cuff repair Right 01/20/2014    Procedure: RIGHT SHOULDER ARTHROSCOPY WITH EXTENSIVE  DEBRIDEMENT, REVISION  ROTATOR CUFF REPAIR AND BICEPS TENOTOMY;  Surgeon: Johnny Bridge, MD;  Location: [facility];  Service: Orthopedics;  Laterality: Right;  .  Tubal ligation  1989  . Lumbar laminectomy/decompression microdiscectomy Left 06/06/2014    Procedure: LUMBAR LAMINECTOMY/DECOMPRESSION MICRODISCECTOMY 1 LEVEL LEFT LUMBAR THREE-FOUR;  Surgeon: Floyce Stakes, MD;  Location: [facility];  Service: Neurosurgery;  Laterality: Left;    History  Smoking status  . Never Smoker   Smokeless tobacco  . Never Used    History  Alcohol Use  . Yes    Comment: wine rarely    Family History  Problem Relation Age of Onset  . Diabetes Mother   . Heart disease Father     Reviw of Systems:  Reviewed in the HPI.  All other systems are negative.  Physical Exam: Blood pressure 138/70, pulse 71, height 5\' 1"  (1.549 m), weight 69.4 kg (153 lb). General: Well developed, well nourished, in no acute distress.  Head: Normocephalic, atraumatic, sclera non-icteric, mucus membranes are moist,   Neck: Supple. Carotids are 2 + without bruits. No JVD  Lungs: Clear bilaterally to auscultation.  Heart: regular rate.  normal  S1 S2. No murmurs, gallops or rubs.  Abdomen: Soft, non-tender, non-distended with normal bowel sounds. No hepatomegaly. No rebound/guarding. No masses.  Msk:  Strength and tone are normal  Extremities: No clubbing or cyanosis. No edema.  Distal pedal pulses are 2+ and equal bilaterally.  Neuro: Alert and oriented X 3. Moves all extremities spontaneously.  Psych:  Responds to questions appropriately with a normal affect.  ECG: 06/13/2015 NSR at 71.  Normal.   Assessment / Plan:   1. Chest pain - she has very atypical episodes of chest discomfort.  She had a normal stress echo card gram several years ago. Her symptoms do not sound like ischemia. 2. Hyperlipidemia -  she's been on atorvastatin in the past. We will refer back to her primary medical doctor. I think that she would benefit from being on low-dose atorvastatin 20 mg or perhaps Crestor 5 mg a day.  3. CKD - stage 3 4. Hypertension 5. Mildly elevated BNP - she had  an insurance physical which revealed a very mildly elevated BNP. I think that this is most likely related to her chronic kidney disease, stage III.  Do not think that this is clinically important. She has no symptoms of CHF. I do not think that this needs any follow-up.  I will see her as needed.    Nahser, Wonda Cheng, MD  06/13/2015 11:29 AM    [address] [place],  [place], [place]  [address] Pager [phone] Phone: [phone]; Fax: [phone]   [facility]  [address] [place], [place]  [address] [phone]    Fax [phone]

## 2017-02-04 ENCOUNTER — Other Ambulatory Visit: Payer: Self-pay | Admitting: Family Medicine

## 2017-02-04 DIAGNOSIS — R1013 Epigastric pain: Secondary | ICD-10-CM

## 2017-02-13 ENCOUNTER — Ambulatory Visit
Admission: RE | Admit: 2017-02-13 | Discharge: 2017-02-13 | Disposition: A | Discharge status: Home / Self Care | Payer: 59 | Source: Ambulatory Visit | Attending: Family Medicine | Admitting: Family Medicine

## 2017-02-13 DIAGNOSIS — R1013 Epigastric pain: Secondary | ICD-10-CM

## 2018-01-14 DIAGNOSIS — Z79899 Other long term (current) drug therapy: Secondary | ICD-10-CM | POA: Diagnosis not present

## 2018-01-14 DIAGNOSIS — R635 Abnormal weight gain: Secondary | ICD-10-CM | POA: Diagnosis not present

## 2018-01-18 DIAGNOSIS — J069 Acute upper respiratory infection, unspecified: Secondary | ICD-10-CM | POA: Diagnosis not present

## 2018-02-11 DIAGNOSIS — Z23 Encounter for immunization: Secondary | ICD-10-CM | POA: Diagnosis not present

## 2018-02-11 DIAGNOSIS — R05 Cough: Secondary | ICD-10-CM | POA: Diagnosis not present

## 2018-02-11 DIAGNOSIS — E785 Hyperlipidemia, unspecified: Secondary | ICD-10-CM | POA: Diagnosis not present

## 2018-02-11 DIAGNOSIS — E559 Vitamin D deficiency, unspecified: Secondary | ICD-10-CM | POA: Diagnosis not present

## 2018-02-11 DIAGNOSIS — R7301 Impaired fasting glucose: Secondary | ICD-10-CM | POA: Diagnosis not present

## 2018-02-11 DIAGNOSIS — F338 Other recurrent depressive disorders: Secondary | ICD-10-CM | POA: Diagnosis not present

## 2018-02-11 DIAGNOSIS — M47816 Spondylosis without myelopathy or radiculopathy, lumbar region: Secondary | ICD-10-CM | POA: Diagnosis not present

## 2018-02-11 DIAGNOSIS — R8290 Unspecified abnormal findings in urine: Secondary | ICD-10-CM | POA: Diagnosis not present

## 2018-02-11 DIAGNOSIS — K219 Gastro-esophageal reflux disease without esophagitis: Secondary | ICD-10-CM | POA: Diagnosis not present

## 2018-02-11 DIAGNOSIS — N189 Chronic kidney disease, unspecified: Secondary | ICD-10-CM | POA: Diagnosis not present

## 2018-02-18 DIAGNOSIS — R635 Abnormal weight gain: Secondary | ICD-10-CM | POA: Diagnosis not present

## 2018-04-26 ENCOUNTER — Encounter (HOSPITAL_COMMUNITY): Payer: Self-pay | Admitting: *Deleted

## 2018-04-26 ENCOUNTER — Emergency Department (HOSPITAL_COMMUNITY)
Admission: EM | Admit: 2018-04-26 | Discharge: 2018-04-27 | Disposition: A | Discharge status: Home / Self Care | Payer: Self-pay | Attending: Emergency Medicine | Admitting: Emergency Medicine

## 2018-04-26 ENCOUNTER — Emergency Department (HOSPITAL_COMMUNITY): Payer: Self-pay

## 2018-04-26 DIAGNOSIS — Z79899 Other long term (current) drug therapy: Secondary | ICD-10-CM | POA: Insufficient documentation

## 2018-04-26 DIAGNOSIS — I10 Essential (primary) hypertension: Secondary | ICD-10-CM | POA: Insufficient documentation

## 2018-04-26 DIAGNOSIS — R0602 Shortness of breath: Secondary | ICD-10-CM | POA: Insufficient documentation

## 2018-04-26 DIAGNOSIS — R0789 Other chest pain: Secondary | ICD-10-CM | POA: Insufficient documentation

## 2018-04-26 LAB — BASIC METABOLIC PANEL
Anion gap: 11 (ref 5–15)
BUN: 18 mg/dL (ref 6–20)
CO2: 27 mmol/L (ref 22–32)
Calcium: 9 mg/dL (ref 8.9–10.3)
Chloride: 96 mmol/L — ABNORMAL LOW (ref 101–111)
Creatinine, Ser: 1.37 mg/dL — ABNORMAL HIGH (ref 0.44–1.00)
GFR calc Af Amer: 46 mL/min — ABNORMAL LOW (ref >60)
GFR calc non Af Amer: 40 mL/min — ABNORMAL LOW (ref >60)
GLUCOSE: 125 mg/dL — AB (ref 65–99)
Potassium: 2.8 mmol/L — ABNORMAL LOW (ref 3.5–5.1)
SODIUM: 134 mmol/L — AB (ref 135–145)

## 2018-04-26 LAB — I-STAT TROPONIN, ED: TROPONIN I, POC: 0.01 ng/mL (ref 0.00–0.08)

## 2018-04-26 LAB — CBC WITH DIFFERENTIAL/PLATELET
Basophils Absolute: 0.1 10*3/uL (ref 0.0–0.1)
Basophils Relative: 1 %
EOS ABS: 0.4 10*3/uL (ref 0.0–0.7)
EOS PCT: 5 %
HCT: 38.6 % (ref 36.0–46.0)
Hemoglobin: 13.1 g/dL (ref 12.0–15.0)
LYMPHS ABS: 2.6 10*3/uL (ref 0.7–4.0)
Lymphocytes Relative: 29 %
MCH: 30.7 pg (ref 26.0–34.0)
MCHC: 33.9 g/dL (ref 30.0–36.0)
MCV: 90.4 fL (ref 78.0–100.0)
Monocytes Absolute: 0.6 10*3/uL (ref 0.1–1.0)
Monocytes Relative: 6 %
Neutro Abs: 5.3 10*3/uL (ref 1.7–7.7)
Neutrophils Relative %: 59 %
Platelets: 298 10*3/uL (ref 150–400)
RBC: 4.27 MIL/uL (ref 3.87–5.11)
RDW: 12.5 % (ref 11.5–15.5)
WBC: 8.9 10*3/uL (ref 4.0–10.5)

## 2018-04-26 MED ORDER — ASPIRIN 81 MG PO CHEW
324.0000 mg | CHEWABLE_TABLET | Freq: Once | ORAL | Status: AC
  Administered 2018-04-27: 324 mg via ORAL
  Filled 2018-04-26: qty 4

## 2018-04-26 NOTE — ED Provider Notes (Signed)
[facility] EMERGENCY DEPARTMENT Provider Note   CSN: [id number] Arrival date & time: 04/26/18  1618     History   Chief Complaint Chief Complaint  Patient presents with  . Chest Pain  . Back Pain    HPI Briana Wolf is a 67 y.o. female.  Briana Wolf is a 67 y.o. Female with a history of hypertension, hyperlipidemia, GERD, renal insufficiency, and arthritis, who presents to the ED for evaluation of central chest pain.  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  Pain did not radiate to the neck or down the arm.  Pain has now resolved. No lightheadedness, or syncope.  She is no longer experiencing any shortness of breath either.  No nausea, vomiting, diaphoresis or abdominal pain.  Denies prior history of cardiac events, patient did have negative stress test with Dr.Nahser in 2013, but does not follow with cardiology regularly.  Of note patient has been on phentermine and Topamax for weight loss.      Past Medical History:  Diagnosis Date  . Arthritis   . Contact lens/glasses fitting    wears contacts or glasses at times  . Coughing    gets cough from dry throat  . Depression   . Family history of anesthesia complication    mom allergic-to many meds-multiple medical problems  . GERD (gastroesophageal reflux disease)   . Hyperlipidemia   . Hypertension   . Renal insufficiency    Dr Brayton El in [facility]  . Tear of right rotator cuff 08/05/2013    Patient Active Problem List   Diagnosis Date Noted  . Lumbar foraminal stenosis 06/06/2014  . Tear of right rotator cuff 08/05/2013  . Hyperlipidemia 10/05/2012  . Hypertension 10/05/2012  . Dyspnea 07/14/2012    Past Surgical History:  Procedure Laterality Date  . BACK SURGERY  2008   cervical fusion   . BACK SURGERY  1998   lumb disk  .  CARPAL TUNNEL RELEASE  1987   left and right wrist   . COLONOSCOPY    . DILATION AND CURETTAGE OF UTERUS  2006  . ELBOW ARTHROSCOPY     ulnar nerve-rt  . FOOT SURGERY  2005   bilateral   . LUMBAR LAMINECTOMY/DECOMPRESSION MICRODISCECTOMY Left 06/06/2014   Procedure: LUMBAR LAMINECTOMY/DECOMPRESSION MICRODISCECTOMY 1 LEVEL LEFT LUMBAR THREE-FOUR;  Surgeon: Floyce Stakes, MD;  Location: [facility];  Service: Neurosurgery;  Laterality: Left;  . ROTATOR CUFF REPAIR  1997   let shoulder   . SHOULDER ARTHROSCOPY WITH ROTATOR CUFF REPAIR Right 01/20/2014   Procedure: RIGHT SHOULDER ARTHROSCOPY WITH EXTENSIVE  DEBRIDEMENT, REVISION  ROTATOR CUFF REPAIR AND BICEPS TENOTOMY;  Surgeon: Johnny Bridge, MD;  Location: [facility];  Service: Orthopedics;  Laterality: Right;  . SHOULDER ARTHROSCOPY WITH ROTATOR CUFF REPAIR AND SUBACROMIAL DECOMPRESSION Right 08/05/2013   Procedure: RIGHT SHOULDER ARTHROSCOPY WITH EXTENSIVE DEBRIDEMENT, ROTATOR CUFF REPAIR AND SUBACROMIAL DECOMPRESSION;  Surgeon: Johnny Bridge, MD;  Location: [facility];  Service: Orthopedics;  Laterality: Right;  . TONSILLECTOMY    . TUBAL LIGATION  1989     OB History   None      Home Medications    Prior to Admission medications   Medication Sig Start Date End Date Taking? Authorizing Provider  buPROPion (WELLBUTRIN XL) 150 MG  24 hr tablet Take 450 mg by mouth every morning.    [provider]  DEXILANT 60 MG capsule Take 60 mg by mouth daily. 06/11/15   [provider]  FLUoxetine (PROZAC) 20 MG tablet Take 20 mg by mouth 2 (two) times daily at 8 am and 10 pm. 05/17/15   [provider]  omega-3 acid ethyl esters (LOVAZA) 1 G capsule Take 2 g by mouth every morning.    [provider]  traMADol (ULTRAM) 50 MG tablet Take 50 mg by mouth every 6 (six) hours as needed for moderate pain.    [provider]  triamterene-hydrochlorothiazide (MAXZIDE-25)  37.5-25 MG per tablet Take 1 tablet by mouth every morning.  07/08/12   [provider]  valACYclovir (VALTREX) 500 MG tablet Take 500-1,000 mg by mouth 2 (two) times daily as needed (for cold sores). At onset of cold sore take 1000 mg twice daily for 1 day, then take 500 mg twice daily for 2 days    [provider]    Family History Family History  Problem Relation Age of Onset  . Diabetes Mother   . Heart disease Father     Social History Social History   Tobacco Use  . Smoking status: Never Smoker  . Smokeless tobacco: Never Used  Substance Use Topics  . Alcohol use: Yes    Comment: wine rarely  . Drug use: No     Allergies   Codeine and Nsaids   Review of Systems Review of Systems  Constitutional: Negative for chills and fever.  HENT: Negative for congestion, rhinorrhea and sore throat.   Eyes: Negative for visual disturbance.  Respiratory: Positive for chest tightness and shortness of breath. Negative for wheezing.   Cardiovascular: Positive for chest pain. Negative for palpitations and leg swelling.  Gastrointestinal: Negative for abdominal pain, nausea and vomiting.  Genitourinary: Negative for dysuria and frequency.  Musculoskeletal: Positive for arthralgias (R shoulder). Negative for back pain and myalgias.  Skin: Negative for color change and rash.  Neurological: Negative for syncope, light-headedness and headaches.     Physical Exam Updated Vital Signs BP 118/76 (BP Location: Right Arm)   Pulse 64   Temp 97.6 F (36.4 C) (Oral)   Resp 18   SpO2 99%   Physical Exam  Constitutional: She is oriented to person, place, and time. She appears well-developed and well-nourished.  Non-toxic appearance. She does not appear ill. No distress.  HENT:  Head: Normocephalic and atraumatic.  Mouth/Throat: Oropharynx is clear and moist.  Eyes: Pupils are equal, round, and reactive to light. EOM are normal. Right eye exhibits no discharge. Left eye  exhibits no discharge.  Neck: Normal range of motion. Neck supple. No JVD present. No tracheal deviation present.  Cardiovascular: Normal rate, regular rhythm, normal heart sounds and intact distal pulses.  Pulmonary/Chest: Effort normal and breath sounds normal. No stridor. No respiratory distress. She has no wheezes. She has no rales. She exhibits no tenderness.  Respirations equal and unlabored, patient able to speak in full sentences, lungs clear to auscultation bilaterally  Abdominal: Soft. Bowel sounds are normal. She exhibits no distension and no mass. There is no tenderness. There is no guarding.  Musculoskeletal: She exhibits no edema or deformity.  Neurological: She is alert and oriented to person, place, and time. Coordination normal.  Speech is clear, able to follow commands CN III-XII intact Normal strength in upper and lower extremities bilaterally including dorsiflexion and plantar flexion, strong and equal  grip strength Sensation normal to light and sharp touch Moves extremities without ataxia, coordination intact  Skin: Skin is warm and dry. Capillary refill takes less than 2 seconds. She is not diaphoretic.  Psychiatric: She has a normal mood and affect. Her behavior is normal.  Nursing note and vitals reviewed.    ED Treatments / Results  Labs (all labs ordered are listed, but only abnormal results are displayed) Labs Reviewed  BASIC METABOLIC PANEL - Abnormal; Notable for the following components:      Result Value   Sodium 134 (*)    Potassium 2.8 (*)    Chloride 96 (*)    Glucose, Bld 125 (*)    Creatinine, Ser 1.37 (*)    GFR calc non Af Amer 40 (*)    GFR calc Af Amer 46 (*)    All other components within normal limits  CBC WITH DIFFERENTIAL/PLATELET  I-STAT TROPONIN, ED  I-STAT TROPONIN, ED    EKG EKG Interpretation  Date/Time:  Monday Apr 26 2018 16:25:55 EDT Ventricular Rate:  79 PR Interval:  148 QRS Duration: 90 QT Interval:  408 QTC  Calculation: 467 R Axis:   -42 Text Interpretation:  Normal sinus rhythm Left axis deviation Abnormal ECG Confirmed by Thayer Jew [phone]) on 04/27/2018 12:07:37 AM   Radiology Dg Chest 2 View  Result Date: 04/26/2018 CLINICAL DATA:  Central chest pain/pressure. EXAM: CHEST - 2 VIEW COMPARISON:  06/01/2014 FINDINGS: Lungs are adequately inflated and otherwise clear. Cardiomediastinal silhouette and remainder of the exam is unchanged. IMPRESSION: No active cardiopulmonary disease. Electronically Signed   By: Marin Olp M.D.   On: 04/26/2018 17:34    Procedures Procedures (including critical care time)  Medications Ordered in ED Medications  aspirin chewable tablet 324 mg (324 mg Oral Given 04/27/18 0038)  potassium chloride 10 mEq in 100 mL IVPB (0 mEq Intravenous Stopped 04/27/18 0322)  potassium chloride SA (K-DUR,KLOR-CON) CR tablet 40 mEq (40 mEq Oral Given 04/27/18 0039)     Initial Impression / Assessment and Plan / ED Course  I have reviewed the triage vital signs and the nursing notes.  Pertinent labs & imaging results that were available during my care of the patient were reviewed by me and considered in my medical decision making (see chart for details).  Patient is to be discharged with recommendation to follow up with PCP and cardiologist in regards to today's hospital visit. Chest pain is not likely of cardiac or pulmonary etiology d/t presentation, she cannot be PERC negative due to age but I have low suspicion for PE without risk factors, patient is not tachycardic, tachypneic or hypoxic, VSS, no tracheal deviation, no JVD or new murmur, RRR, breath sounds equal bilaterally, EKG without acute abnormalities, negative troponin x 2, and negative CXR.  Heart pathway score of 3.  Patient counseled on dangers of phentermine at her age, suggested the patient discussed with her primary care doctor and cardiologist before continuing these weight loss medications.  Pt has been advised  to return to the ED if CP becomes exertional, associated with diaphoresis or nausea, radiates to left jaw/arm, worsens or becomes concerning in any way. Pt appears reliable for follow up and is agreeable to discharge.   Case has been discussed with and seen by Dr. Dina Rich who agrees with the above plan to discharge.   Final Clinical Impressions(s) / ED Diagnoses   Final diagnoses:  Atypical chest pain  SOB (shortness of breath)    ED Discharge Orders  Ordered    potassium chloride SA (K-DUR,KLOR-CON) 20 MEQ tablet  Daily     04/27/18 0316       Jacqlyn Larsen, PA-C 04/27/18 8101    Merryl Hacker, MD 04/28/18 [phone]

## 2018-04-26 NOTE — ED Provider Notes (Signed)
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 Reports felt like "elephat sitting on chest." pain improved now. Reports associated back pain and sob. Currently on and off some weight loss medications. Denies hx of heart problems. Reports she is also having dry, non productive cough for several days.   ROS: positive for chest pain, nausea, sob, chest pain  Physical Exam:   Gen: No distress  Neuro: Awake and Alert  Skin: Warm    Focused Exam: nad, lungs clear bilaterally. Regular hr and rhythm. No chest wall tenderness.   Initiation of care has begun. The patient has been counseled on the process, plan, and necessity for staying for the completion/evaluation, and the remainder of the medical screening examination  Pt with cp, sob, nausea. ECG with no acute findings. Labs and cxr ordered.   Vitals:   04/26/18 1626  BP: 115/69  Pulse: 81  Resp: 16  Temp: (!) 97.5 F (36.4 C)  TempSrc: Oral  SpO2: 100%      Jeannett Senior, PA-C 04/26/18 1635    Noemi Chapel, MD 04/28/18 [phone]

## 2018-04-26 NOTE — ED Triage Notes (Addendum)
To ED for eval of feeling like an elephant sitting on her chest. Pt states the pain moved to right shoulder but is now very minimal. 1.5/10 pain. Denies nausea or vomiting. Skin w/d, resp e/u. Pt is currently taking Phentermine and Topamax for weight loss - given by [facility].

## 2018-04-27 LAB — I-STAT TROPONIN, ED: Troponin i, poc: 0.01 ng/mL (ref 0.00–0.08)

## 2018-04-27 MED ORDER — POTASSIUM CHLORIDE CRYS ER 20 MEQ PO TBCR
40.0000 meq | EXTENDED_RELEASE_TABLET | Freq: Once | ORAL | Status: AC
  Administered 2018-04-27: 40 meq via ORAL
  Filled 2018-04-27: qty 2

## 2018-04-27 MED ORDER — POTASSIUM CHLORIDE CRYS ER 20 MEQ PO TBCR: 20.0000 meq | EXTENDED_RELEASE_TABLET | Freq: Every day | ORAL | 0 refills | Status: DC

## 2018-04-27 MED ORDER — POTASSIUM CHLORIDE 10 MEQ/100ML IV SOLN
10.0000 meq | Freq: Once | INTRAVENOUS | Status: AC
  Administered 2018-04-27: 10 meq via INTRAVENOUS
  Filled 2018-04-27: qty 100

## 2018-04-27 NOTE — ED Notes (Signed)
Patient left at this time with all belongings. 

## 2018-04-27 NOTE — Discharge Instructions (Signed)
Your evaluation today is reassuring, labs, EKG and chest x-ray do not suggest an acute cardiac or lung problem causing her symptoms today.  Your potassium was a little bit low this was replaced during the ED, please continue taking potassium daily for the next 5 days.  You will need to follow-up with your primary care doctor for recheck of your potassium.  You will also need to follow-up with your cardiologist.  Return to the emergency department for worsening or persistent chest pain especially if it radiates to the arm neck or jaw, shortness of breath, lightheadedness or feeling like you are going to pass out or any other new or concerning symptoms.

## 2019-06-30 DIAGNOSIS — R0600 Dyspnea, unspecified: Secondary | ICD-10-CM | POA: Diagnosis not present

## 2019-06-30 DIAGNOSIS — R5383 Other fatigue: Secondary | ICD-10-CM | POA: Diagnosis not present

## 2019-06-30 DIAGNOSIS — E785 Hyperlipidemia, unspecified: Secondary | ICD-10-CM | POA: Diagnosis not present

## 2019-07-06 DIAGNOSIS — M5416 Radiculopathy, lumbar region: Secondary | ICD-10-CM | POA: Diagnosis not present

## 2019-07-18 DIAGNOSIS — M5416 Radiculopathy, lumbar region: Secondary | ICD-10-CM | POA: Diagnosis not present

## 2019-07-20 DIAGNOSIS — M5136 Other intervertebral disc degeneration, lumbar region: Secondary | ICD-10-CM | POA: Diagnosis not present

## 2019-07-20 DIAGNOSIS — M5416 Radiculopathy, lumbar region: Secondary | ICD-10-CM | POA: Diagnosis not present

## 2019-07-20 DIAGNOSIS — M5126 Other intervertebral disc displacement, lumbar region: Secondary | ICD-10-CM | POA: Diagnosis not present

## 2019-07-20 DIAGNOSIS — M48061 Spinal stenosis, lumbar region without neurogenic claudication: Secondary | ICD-10-CM | POA: Diagnosis not present

## 2019-07-21 DIAGNOSIS — M48062 Spinal stenosis, lumbar region with neurogenic claudication: Secondary | ICD-10-CM | POA: Diagnosis not present

## 2019-07-22 DIAGNOSIS — R635 Abnormal weight gain: Secondary | ICD-10-CM | POA: Diagnosis not present

## 2019-09-05 NOTE — Progress Notes (Deleted)
Briana Wolf [id number] Date of Birth  August 30, 1951       [facility] Office [address], [address], [place], [place]  [address]   [place], [place]  [address] [phone]     [phone]   Fax  [phone]    Fax [phone]  Problem List: 1. Chest pain 2. Hyperlipidemia 3. CKD - stage 3 4. Hypertension  History of Present Illness:  Briana Wolf is a 68 yo with a strong family history of CAD (father with MI, mother has a pacer, sister has coronary athersclerosis).  She has occasional CP ( upper chest in the throat area).   She has some dyspnea with exercise which is her main concern today.  She walks her dog  on occasion.  She has not had any further episodes of dyspnea.  She was previously on atorvastatin but does not remember the dose. Atorvastatin was apparently discontinued when her liver enzymes were elevated.  Her most recent cholesterol level reveals an LDL of 133.  June 13, 2015:   Briana Wolf is seen back today after a 3 year absence.  Her husband, Briana Wolf , now has been diagnosed with dementia.   She is working 2 part time jobs.  Has some vague chest pains on rare occasion.  Not related to exertion.  Possibly related to GERD. She does not get regular exercise but is very busy.    She recently had a life insurance physical performed. She was found have a mildly elevated  BNP.  She was also found to have mild hyperlipidemia .   She has no symptoms . No dyspnea  She had a normal stress echocardiogram in 2013 showed normal left ventricle systolic function at that time.  Sept. 15, 2020  Briana Wolf is seen today for a follow up visit.   I last saw her in 2016 for eval of atypical CP She has a hx of HTN, HLD, renal insufficiency. She was seen in the ER for CP on 04/27/19 She was referred to me for further evaluation of her chest pain by Dr. Rex Kras     Current Outpatient Medications on File Prior to Visit  Medication Sig Dispense Refill   . buPROPion (WELLBUTRIN XL) 150 MG 24 hr tablet Take 450 mg by mouth daily.     Marland Kitchen FLUoxetine (PROZAC) 20 MG tablet Take 20 mg by mouth at bedtime.     . Pantoprazole Sodium (PROTONIX PO) Take 1 tablet by mouth daily.    . phentermine 37.5 MG capsule Take 37.5 mg by mouth daily.    . potassium chloride SA (K-DUR,KLOR-CON) 20 MEQ tablet Take 1 tablet (20 mEq total) by mouth daily. 3 tablet 0  . triamterene-hydrochlorothiazide (MAXZIDE-25) 37.5-25 MG per tablet Take 1 tablet by mouth daily.      No current facility-administered medications on file prior to visit.     Allergies  Allergen Reactions  . Codeine Nausea Only    Makes pt nauseous   . Nsaids     Decreasing GFR, told to not take NSAIDS again    Past Medical History:  Diagnosis Date  . Arthritis   . Contact lens/glasses fitting    wears contacts or glasses at times  . Coughing    gets cough from dry throat  . Depression   . Family history of anesthesia complication    mom allergic-to many meds-multiple medical problems  . GERD (gastroesophageal reflux disease)   . Hyperlipidemia   .  Hypertension   . Renal insufficiency    [facility] Brayton El [facility]  . Tear of right rotator cuff 08/05/2013    Past Surgical History:  Procedure Laterality Date  . BACK SURGERY  2008   cervical fusion   . BACK SURGERY  1998   lumb disk  . CARPAL TUNNEL RELEASE  1987   left and right wrist   . COLONOSCOPY    . DILATION AND CURETTAGE OF UTERUS  2006  . ELBOW ARTHROSCOPY     ulnar nerve-rt  . FOOT SURGERY  2005   bilateral   . LUMBAR LAMINECTOMY/DECOMPRESSION MICRODISCECTOMY Left 06/06/2014   Procedure: LUMBAR LAMINECTOMY/DECOMPRESSION MICRODISCECTOMY 1 LEVEL LEFT LUMBAR THREE-FOUR;  Surgeon: Floyce Stakes, MD;  Location: [facility];  Service: Neurosurgery;  Laterality: Left;  . ROTATOR CUFF REPAIR  1997   let shoulder   . SHOULDER ARTHROSCOPY WITH ROTATOR CUFF REPAIR Right 01/20/2014   Procedure: RIGHT SHOULDER ARTHROSCOPY WITH  EXTENSIVE  DEBRIDEMENT, REVISION  ROTATOR CUFF REPAIR AND BICEPS TENOTOMY;  Surgeon: Johnny Bridge, MD;  Location: [place];  Service: Orthopedics;  Laterality: Right;  . SHOULDER ARTHROSCOPY WITH ROTATOR CUFF REPAIR AND SUBACROMIAL DECOMPRESSION Right 08/05/2013   Procedure: RIGHT SHOULDER ARTHROSCOPY WITH EXTENSIVE DEBRIDEMENT, ROTATOR CUFF REPAIR AND SUBACROMIAL DECOMPRESSION;  Surgeon: Johnny Bridge, MD;  Location: [place];  Service: Orthopedics;  Laterality: Right;  . TONSILLECTOMY    . TUBAL LIGATION  1989    Social History   Tobacco Use  Smoking Status Never Smoker  Smokeless Tobacco Never Used    Social History   Substance and Sexual Activity  Alcohol Use Yes   Comment: wine rarely    Family History  Problem Relation Age of Onset  . Diabetes Mother   . Heart disease Father     Reviw of Systems:  Reviewed in the HPI.  All other systems are negative.   Physical Exam: There were no vitals taken for this visit.  GEN:  Well nourished, well developed in no acute distress HEENT: Normal NECK: No JVD; No carotid bruits LYMPHATICS: No lymphadenopathy CARDIAC: RRR ***, no murmurs, rubs, gallops RESPIRATORY:  Clear to auscultation without rales, wheezing or rhonchi  ABDOMEN: Soft, non-tender, non-distended MUSCULOSKELETAL:  No edema; No deformity  SKIN: Warm and dry NEUROLOGIC:  Alert and oriented x 3  ECG:    Assessment / Plan:   1. Chest pain - *** . 2. Hyperlipidemia -  ***  3. CKD - stage 3  4. Hypertension:  ***  5. Mildly elevated BNP -      Mertie Moores, MD  09/05/2019 8:42 PM    [address],  [place], [place]  [address] Pager [phone] Phone: [phone]; Fax: [phone]   [facility]  [address] [place], [place]  [address] [phone]    Fax [phone]

## 2019-09-06 ENCOUNTER — Ambulatory Visit: Payer: Self-pay | Admitting: Cardiovascular Disease

## 2019-09-14 DIAGNOSIS — G894 Chronic pain syndrome: Secondary | ICD-10-CM | POA: Diagnosis not present

## 2019-09-14 DIAGNOSIS — E785 Hyperlipidemia, unspecified: Secondary | ICD-10-CM | POA: Diagnosis not present

## 2019-09-14 DIAGNOSIS — L989 Disorder of the skin and subcutaneous tissue, unspecified: Secondary | ICD-10-CM | POA: Diagnosis not present

## 2019-09-14 DIAGNOSIS — M47816 Spondylosis without myelopathy or radiculopathy, lumbar region: Secondary | ICD-10-CM | POA: Diagnosis not present

## 2019-09-14 DIAGNOSIS — F338 Other recurrent depressive disorders: Secondary | ICD-10-CM | POA: Diagnosis not present

## 2019-09-14 DIAGNOSIS — R5383 Other fatigue: Secondary | ICD-10-CM | POA: Diagnosis not present

## 2019-09-15 DIAGNOSIS — H353132 Nonexudative age-related macular degeneration, bilateral, intermediate dry stage: Secondary | ICD-10-CM | POA: Diagnosis not present

## 2019-09-15 DIAGNOSIS — H2513 Age-related nuclear cataract, bilateral: Secondary | ICD-10-CM | POA: Diagnosis not present

## 2019-09-15 DIAGNOSIS — H524 Presbyopia: Secondary | ICD-10-CM | POA: Diagnosis not present

## 2019-09-19 DIAGNOSIS — L821 Other seborrheic keratosis: Secondary | ICD-10-CM | POA: Diagnosis not present

## 2019-09-19 DIAGNOSIS — D225 Melanocytic nevi of trunk: Secondary | ICD-10-CM | POA: Diagnosis not present

## 2019-09-19 DIAGNOSIS — D1801 Hemangioma of skin and subcutaneous tissue: Secondary | ICD-10-CM | POA: Diagnosis not present

## 2019-09-19 DIAGNOSIS — D2239 Melanocytic nevi of other parts of face: Secondary | ICD-10-CM | POA: Diagnosis not present

## 2019-09-26 DIAGNOSIS — M48062 Spinal stenosis, lumbar region with neurogenic claudication: Secondary | ICD-10-CM | POA: Diagnosis not present

## 2019-09-26 DIAGNOSIS — E559 Vitamin D deficiency, unspecified: Secondary | ICD-10-CM | POA: Diagnosis not present

## 2019-09-26 DIAGNOSIS — Z79899 Other long term (current) drug therapy: Secondary | ICD-10-CM | POA: Diagnosis not present

## 2019-09-26 DIAGNOSIS — R635 Abnormal weight gain: Secondary | ICD-10-CM | POA: Diagnosis not present

## 2019-09-26 DIAGNOSIS — Z1159 Encounter for screening for other viral diseases: Secondary | ICD-10-CM | POA: Diagnosis not present

## 2019-09-26 DIAGNOSIS — M5416 Radiculopathy, lumbar region: Secondary | ICD-10-CM | POA: Diagnosis not present

## 2019-09-26 DIAGNOSIS — E782 Mixed hyperlipidemia: Secondary | ICD-10-CM | POA: Diagnosis not present

## 2019-10-02 DIAGNOSIS — R05 Cough: Secondary | ICD-10-CM | POA: Diagnosis not present

## 2019-10-02 DIAGNOSIS — J0100 Acute maxillary sinusitis, unspecified: Secondary | ICD-10-CM | POA: Diagnosis not present

## 2019-10-06 DIAGNOSIS — J029 Acute pharyngitis, unspecified: Secondary | ICD-10-CM | POA: Diagnosis not present

## 2019-10-06 DIAGNOSIS — R197 Diarrhea, unspecified: Secondary | ICD-10-CM | POA: Diagnosis not present

## 2019-10-06 DIAGNOSIS — R05 Cough: Secondary | ICD-10-CM | POA: Diagnosis not present

## 2019-10-06 DIAGNOSIS — R0602 Shortness of breath: Secondary | ICD-10-CM | POA: Diagnosis not present

## 2019-10-07 DIAGNOSIS — R05 Cough: Secondary | ICD-10-CM | POA: Diagnosis not present

## 2019-10-11 DIAGNOSIS — M5416 Radiculopathy, lumbar region: Secondary | ICD-10-CM | POA: Diagnosis not present

## 2019-10-11 DIAGNOSIS — Z6828 Body mass index (BMI) 28.0-28.9, adult: Secondary | ICD-10-CM | POA: Diagnosis not present

## 2019-10-17 NOTE — Progress Notes (Signed)
Briana Wolf [id number] Date of Birth  01-May-1951       [facility] Office [address], [place], [place]  [address]   [place], [place]  [address] [phone]     [phone]   Fax  [phone]    Fax [phone]  Problem List: 1. Chest pain 2. Hyperlipidemia 3. CKD - stage 3 4. Hypertension  Previous notes:  Briana Wolf is a 68 yo with a strong family history of CAD (father with MI, mother has a pacer, sister has coronary athersclerosis).  She has occasional CP ( upper chest in the throat area).   She has some dyspnea with exercise which is her main concern today.  She walks her dog  on occasion.  She has not had any further episodes of dyspnea.  She was previously on atorvastatin but does not remember the dose. Atorvastatin was apparently discontinued when her liver enzymes were elevated.  Her most recent cholesterol level reveals an LDL of 133.  June 13, 2015:   Briana Wolf is seen back today after a 3 year absence.  Her husband, Dominica Severin , now has been diagnosed with dementia.   She is working 2 part time jobs.  Has some vague chest pains on rare occasion.  Not related to exertion.  Possibly related to GERD. She does not get regular exercise but is very busy.    She recently had a life insurance physical performed. She was found have a mildly elevated  BNP.  She was also found to have mild hyperlipidemia .   She has no symptoms . No dyspnea  She had a normal stress echocardiogram in 2013 showed normal left ventricle systolic function at that time.  Oct.  27, 2020 Briana Wolf is seen today for follow up of her hyperlipidemia and strong family hx of CAD  She has been having some atypical episodes of chest discomfort.  We are asked to see her today by Dr. Hulan Fess for further evaluation of this chest pain.    Husband passed away 2 weeks ago Has had lots of stress , heart burn since that time  Is not eating well.  Not  exercising   Has had chest tightness  More shortness of breath recently  Walks her dogs but not much exercise besides that     Current Outpatient Medications on File Prior to Visit  Medication Sig Dispense Refill  . buPROPion (WELLBUTRIN XL) 150 MG 24 hr tablet Take 450 mg by mouth daily.     Marland Kitchen FLUoxetine (PROZAC) 20 MG tablet Take 20 mg by mouth at bedtime.     . Pantoprazole Sodium (PROTONIX PO) Take 1 tablet by mouth daily.    . phentermine 37.5 MG capsule Take 37.5 mg by mouth daily.    . potassium chloride SA (K-DUR,KLOR-CON) 20 MEQ tablet Take 1 tablet (20 mEq total) by mouth daily. 3 tablet 0  . triamterene-hydrochlorothiazide (MAXZIDE-25) 37.5-25 MG per tablet Take 1 tablet by mouth daily.      No current facility-administered medications on file prior to visit.     Allergies  Allergen Reactions  . Codeine Nausea Only    Makes pt nauseous   . Nsaids     Decreasing GFR, told to not take NSAIDS again    Past Medical History:  Diagnosis Date  . Arthritis   . Contact lens/glasses fitting    wears contacts or glasses at times  . Coughing  gets cough from dry throat  . Depression   . Family history of anesthesia complication    mom allergic-to many meds-multiple medical problems  . GERD (gastroesophageal reflux disease)   . Hyperlipidemia   . Hypertension   . Renal insufficiency    Dr Brayton El in [facility]  . Tear of right rotator cuff 08/05/2013    Past Surgical History:  Procedure Laterality Date  . BACK SURGERY  2008   cervical fusion   . BACK SURGERY  1998   lumb disk  . CARPAL TUNNEL RELEASE  1987   left and right wrist   . COLONOSCOPY    . DILATION AND CURETTAGE OF UTERUS  2006  . ELBOW ARTHROSCOPY     ulnar nerve-rt  . FOOT SURGERY  2005   bilateral   . LUMBAR LAMINECTOMY/DECOMPRESSION MICRODISCECTOMY Left 06/06/2014   Procedure: LUMBAR LAMINECTOMY/DECOMPRESSION MICRODISCECTOMY 1 LEVEL LEFT LUMBAR THREE-FOUR;  Surgeon: Floyce Stakes, MD;   Location: [place] [facility];  Service: Neurosurgery;  Laterality: Left;  . ROTATOR CUFF REPAIR  1997   let shoulder   . SHOULDER ARTHROSCOPY WITH ROTATOR CUFF REPAIR Right 01/20/2014   Procedure: RIGHT SHOULDER ARTHROSCOPY WITH EXTENSIVE  DEBRIDEMENT, REVISION  ROTATOR CUFF REPAIR AND BICEPS TENOTOMY;  Surgeon: Johnny Bridge, MD;  Location: [facility];  Service: Orthopedics;  Laterality: Right;  . SHOULDER ARTHROSCOPY WITH ROTATOR CUFF REPAIR AND SUBACROMIAL DECOMPRESSION Right 08/05/2013   Procedure: RIGHT SHOULDER ARTHROSCOPY WITH EXTENSIVE DEBRIDEMENT, ROTATOR CUFF REPAIR AND SUBACROMIAL DECOMPRESSION;  Surgeon: Johnny Bridge, MD;  Location: [facility];  Service: Orthopedics;  Laterality: Right;  . TONSILLECTOMY    . TUBAL LIGATION  1989    Social History   Tobacco Use  Smoking Status Never Smoker  Smokeless Tobacco Never Used    Social History   Substance and Sexual Activity  Alcohol Use Yes   Comment: wine rarely    Family History  Problem Relation Age of Onset  . Diabetes Mother   . Heart disease Father     Reviw of Systems:  Reviewed in the HPI.  All other systems are negative.  Physical Exam: There were no vitals taken for this visit.  GEN:  Well nourished, well developed in no acute distress HEENT: Normal NECK: No JVD; No carotid bruits LYMPHATICS: No lymphadenopathy CARDIAC: RRR  RESPIRATORY:  Clear to auscultation without rales, wheezing or rhonchi  ABDOMEN: Soft, non-tender, non-distended MUSCULOSKELETAL:  No edema; No deformity  SKIN: Warm and dry NEUROLOGIC:  Alert and oriented x 3   ECG: Oct. 27, 2020 :     NSR .  Poor R wave progression in ant. Lat leads.   Assessment / Plan:   1. Chest pain - atypical ,   I suspect most of her symptoms are due to grieving and not actually CAD ECG shows poor R wave progression .   She has not had any symtoms of an ant. Lat Mi  Will get an echo  2. Hyperlipidemia - stabel for  now   3. CKD - stage 3  4. Hypertension - BP is well controlled.   5. Mildly elevated BNP -     Mertie Moores, MD  10/17/2019 8:55 PM    [facility] [address],  [place], [place]  [address] Pager [phone] Phone: [phone]; Fax: [phone]

## 2019-10-18 ENCOUNTER — Ambulatory Visit: Payer: PPO | Admitting: Cardiovascular Disease

## 2019-10-18 ENCOUNTER — Encounter: Payer: Self-pay | Admitting: Cardiovascular Disease

## 2019-10-18 ENCOUNTER — Other Ambulatory Visit: Payer: Self-pay

## 2019-10-18 VITALS — BP 108/66 | HR 76 | Ht 61.0 in | Wt 156.6 lb

## 2019-10-18 DIAGNOSIS — I10 Essential (primary) hypertension: Secondary | ICD-10-CM

## 2019-10-18 DIAGNOSIS — R0789 Other chest pain: Secondary | ICD-10-CM | POA: Diagnosis not present

## 2019-10-18 DIAGNOSIS — R9431 Abnormal electrocardiogram [ECG] [EKG]: Secondary | ICD-10-CM | POA: Diagnosis not present

## 2019-10-18 NOTE — Patient Instructions (Addendum)
[facility]  [phone]  Medication Instructions:  Your physician recommends that you continue on your current medications as directed. Please refer to the Current Medication list given to you today.  *If you need a refill on your cardiac medications before your next appointment, please call your pharmacy*  Lab Work: None Ordered  If you have labs (blood work) drawn today and your tests are completely normal, you will receive your results only by: Marland Kitchen MyChart Message (if you have MyChart) OR . A paper copy in the mail If you have any lab test that is abnormal or we need to change your treatment, we will call you to review the results.  Testing/Procedures: Your physician has requested that you have an echocardiogram. Echocardiography is a painless test that uses sound waves to create images of your heart. It provides your doctor with information about the size and shape of your heart and how well your heart's chambers and valves are working. This procedure takes approximately one hour. There are no restrictions for this procedure.    Follow-Up: At [facility], you and your health needs are our priority.  As part of our continuing mission to provide you with exceptional heart care, we have created designated Provider Care Teams.  These Care Teams include your primary Cardiologist (physician) and Advanced Practice Providers (APPs -  Physician Assistants and Nurse Practitioners) who all work together to provide you with the care you need, when you need it.  Your next appointment:   3 months  The format for your next appointment:   In Person  Provider:   You may see Dr. Acie Fredrickson or one of the following Advanced Practice Providers on your designated Care Team:    Richardson Dopp, PA-C  [place], [place]  Daune Perch, [place]

## 2019-10-27 ENCOUNTER — Ambulatory Visit (HOSPITAL_COMMUNITY): Payer: PPO | Attending: Cardiology

## 2019-10-27 ENCOUNTER — Other Ambulatory Visit: Payer: Self-pay

## 2019-10-27 DIAGNOSIS — R9431 Abnormal electrocardiogram [ECG] [EKG]: Secondary | ICD-10-CM | POA: Diagnosis not present

## 2019-10-31 DIAGNOSIS — R05 Cough: Secondary | ICD-10-CM | POA: Diagnosis not present

## 2019-10-31 DIAGNOSIS — K219 Gastro-esophageal reflux disease without esophagitis: Secondary | ICD-10-CM | POA: Diagnosis not present

## 2019-10-31 DIAGNOSIS — J0100 Acute maxillary sinusitis, unspecified: Secondary | ICD-10-CM | POA: Diagnosis not present

## 2019-11-11 DIAGNOSIS — R05 Cough: Secondary | ICD-10-CM | POA: Diagnosis not present

## 2019-11-11 DIAGNOSIS — K219 Gastro-esophageal reflux disease without esophagitis: Secondary | ICD-10-CM | POA: Diagnosis not present

## 2019-11-12 DIAGNOSIS — R05 Cough: Secondary | ICD-10-CM | POA: Diagnosis not present

## 2019-11-15 DIAGNOSIS — J40 Bronchitis, not specified as acute or chronic: Secondary | ICD-10-CM | POA: Diagnosis not present

## 2019-11-15 DIAGNOSIS — R05 Cough: Secondary | ICD-10-CM | POA: Diagnosis not present

## 2019-12-06 DIAGNOSIS — R635 Abnormal weight gain: Secondary | ICD-10-CM | POA: Diagnosis not present

## 2019-12-06 DIAGNOSIS — E782 Mixed hyperlipidemia: Secondary | ICD-10-CM | POA: Diagnosis not present

## 2020-01-18 ENCOUNTER — Other Ambulatory Visit: Payer: Self-pay

## 2020-01-18 ENCOUNTER — Encounter: Payer: Self-pay | Admitting: Cardiovascular Disease

## 2020-01-18 ENCOUNTER — Ambulatory Visit: Payer: PPO | Admitting: Cardiovascular Disease

## 2020-01-18 VITALS — BP 120/72 | HR 70 | Ht 61.0 in | Wt 161.8 lb

## 2020-01-18 DIAGNOSIS — R0789 Other chest pain: Secondary | ICD-10-CM | POA: Diagnosis not present

## 2020-01-18 DIAGNOSIS — E782 Mixed hyperlipidemia: Secondary | ICD-10-CM | POA: Diagnosis not present

## 2020-01-18 NOTE — Patient Instructions (Signed)
Medication Instructions:  Your physician recommends that you continue on your current medications as directed. Please refer to the Current Medication list given to you today.  *If you need a refill on your cardiac medications before your next appointment, please call your pharmacy*  Lab Work: None If you have labs (blood work) drawn today and your tests are completely normal, you will receive your results only by: Marland Kitchen MyChart Message (if you have MyChart) OR . A paper copy in the mail If you have any lab test that is abnormal or we need to change your treatment, we will call you to review the results.  Testing/Procedures: None  Follow-Up: At [facility], you and your health needs are our priority.  As part of our continuing mission to provide you with exceptional heart care, we have created designated Provider Care Teams.  These Care Teams include your primary Cardiologist (physician) and Advanced Practice Providers (APPs -  Physician Assistants and Nurse Practitioners) who all work together to provide you with the care you need, when you need it.  Your next appointment:   As needed   The format for your next appointment:   In Person  Provider:   You may see Mertie Moores, MD or one of the following Advanced Practice Providers on your designated Care Team:    Richardson Dopp, PA-C  [place], [place]  Daune Perch, NP   Other Instructions

## 2020-01-18 NOTE — Progress Notes (Signed)
Briana Wolf [id number] Date of Birth  [birth year]       [facility] Office [address], [address], [place], [place]  [address]   [place], [place]  [address] [phone]     [phone]   Fax  [phone]    Fax [phone]  Problem List: 1. Chest pain 2. Hyperlipidemia 3. CKD - stage 3 4. Hypertension  Previous notes:  Briana Wolf is a 69 yo with a strong family history of CAD (father with MI, mother has a pacer, sister has coronary athersclerosis).  She has occasional CP ( upper chest in the throat area).   She has some dyspnea with exercise which is her main concern today.  She walks her dog  on occasion.  She has not had any further episodes of dyspnea.  She was previously on atorvastatin but does not remember the dose. Atorvastatin was apparently discontinued when her liver enzymes were elevated.  Her most recent cholesterol level reveals an LDL of 133.  June 13, 2015:   Briana Wolf is seen back today after a 3 year absence.  Her husband, Dominica Severin , now has been diagnosed with dementia.   She is working 2 part time jobs.  Has some vague chest pains on rare occasion.  Not related to exertion.  Possibly related to GERD. She does not get regular exercise but is very busy.    She recently had a life insurance physical performed. She was found have a mildly elevated  BNP.  She was also found to have mild hyperlipidemia .   She has no symptoms . No dyspnea  She had a normal stress echocardiogram in 2013 showed normal left ventricle systolic function at that time.  Oct.  27, 2020 Briana Wolf is seen today for follow up of her hyperlipidemia and strong family hx of CAD  She has been having some atypical episodes of chest discomfort.  We are asked to see her today by Dr. Hulan Fess for further evaluation of this chest pain.    Husband passed away 2 weeks ago Has had lots of stress , heart burn since that time  Is not eating well.  Not  exercising   Has had chest tightness  More shortness of breath recently  [facility] her dogs but not much exercise besides that   Jan. 27, 2021:  I saw Briana Wolf in October.  She was having lots of chest discomfort.  Her husband had passed away several weeks prior and she was still grieving.  We performed an echocardiogram that showed normal LV function Has GERD symptoms  - takes pantoprazole  Has not been exercising much. Having some lower back pain issues. Has a disc issue / sciatica    Current Outpatient Medications on File Prior to Visit  Medication Sig Dispense Refill  . buPROPion (WELLBUTRIN XL) 150 MG 24 hr tablet Take 450 mg by mouth daily.     Marland Kitchen FLUoxetine (PROZAC) 20 MG tablet Take 20 mg by mouth at bedtime.     Marland Kitchen losartan (COZAAR) 50 MG tablet Take 50 mg by mouth daily.     . Pantoprazole Sodium (PROTONIX PO) Take 1 tablet by mouth daily.    . phentermine 37.5 MG capsule Take 37.5 mg by mouth daily.    . traZODone (DESYREL) 100 MG tablet Take 50 mg by mouth at bedtime as needed for sleep.    Marland Kitchen triamterene-hydrochlorothiazide (MAXZIDE-25) 37.5-25 MG per tablet Take 1 tablet  by mouth daily.     . benzonatate (TESSALON) 100 MG capsule Take 100 mg by mouth 3 (three) times daily as needed.     No current facility-administered medications on file prior to visit.    Allergies  Allergen Reactions  . Codeine Nausea Only    Makes pt nauseous   . Nsaids     Decreasing GFR, told to not take NSAIDS again    Past Medical History:  Diagnosis Date  . Arthritis   . Contact lens/glasses fitting    wears contacts or glasses at times  . Coughing    gets cough from dry throat  . Depression   . Family history of anesthesia complication    mom allergic-to many meds-multiple medical problems  . GERD (gastroesophageal reflux disease)   . Hyperlipidemia   . Hypertension   . Renal insufficiency    Dr Brayton El in [facility]  . Tear of right rotator cuff 08/05/2013    Past Surgical History:   Procedure Laterality Date  . BACK SURGERY  2008   cervical fusion   . BACK SURGERY  1998   lumb disk  . CARPAL TUNNEL RELEASE  1987   left and right wrist   . COLONOSCOPY    . DILATION AND CURETTAGE OF UTERUS  2006  . ELBOW ARTHROSCOPY     ulnar nerve-rt  . FOOT SURGERY  2005   bilateral   . LUMBAR LAMINECTOMY/DECOMPRESSION MICRODISCECTOMY Left 06/06/2014   Procedure: LUMBAR LAMINECTOMY/DECOMPRESSION MICRODISCECTOMY 1 LEVEL LEFT LUMBAR THREE-FOUR;  Surgeon: Floyce Stakes, MD;  Location: [facility];  Service: Neurosurgery;  Laterality: Left;  . ROTATOR CUFF REPAIR  1997   let shoulder   . SHOULDER ARTHROSCOPY WITH ROTATOR CUFF REPAIR Right 01/20/2014   Procedure: RIGHT SHOULDER ARTHROSCOPY WITH EXTENSIVE  DEBRIDEMENT, REVISION  ROTATOR CUFF REPAIR AND BICEPS TENOTOMY;  Surgeon: Johnny Bridge, MD;  Location: [facility];  Service: Orthopedics;  Laterality: Right;  . SHOULDER ARTHROSCOPY WITH ROTATOR CUFF REPAIR AND SUBACROMIAL DECOMPRESSION Right 08/05/2013   Procedure: RIGHT SHOULDER ARTHROSCOPY WITH EXTENSIVE DEBRIDEMENT, ROTATOR CUFF REPAIR AND SUBACROMIAL DECOMPRESSION;  Surgeon: Johnny Bridge, MD;  Location: [facility];  Service: Orthopedics;  Laterality: Right;  . TONSILLECTOMY    . TUBAL LIGATION  1989    Social History   Tobacco Use  Smoking Status Never Smoker  Smokeless Tobacco Never Used    Social History   Substance and Sexual Activity  Alcohol Use Yes   Comment: wine rarely    Family History  Problem Relation Age of Onset  . Diabetes Mother   . Heart disease Father     Reviw of Systems:  Reviewed in the HPI.  All other systems are negative.  Physical Exam: Blood pressure 120/72, pulse 70, height 5\' 1"  (1.549 m), weight 161 lb 12.8 oz (73.4 kg), SpO2 98 %.  GEN:  Well nourished, well developed in no acute distress HEENT: Normal NECK: No JVD; No carotid bruits LYMPHATICS: No lymphadenopathy CARDIAC: RRR , no murmurs,  rubs, gallops RESPIRATORY:  Clear to auscultation without rales, wheezing or rhonchi  ABDOMEN: Soft, non-tender, non-distended MUSCULOSKELETAL:  No edema; No deformity  SKIN: Warm and dry NEUROLOGIC:  Alert and oriented x 3   ECG:  Assessment / Plan:   1. Chest pain - atypical ,   Echo looks normal     2. Hyperlipidemia - minimally elevated.   Followed by Dr. Rex Kras    3. CKD -  4. Hypertension -   BP is well controlled.   5. Mildly elevated BNP -     Mertie Moores, MD  01/18/2020 11:27 AM    [facility] [place],  [place],   [address] Pager [phone] Phone: [phone]; Fax: [phone]

## 2020-01-25 DIAGNOSIS — Z20828 Contact with and (suspected) exposure to other viral communicable diseases: Secondary | ICD-10-CM | POA: Diagnosis not present

## 2020-01-25 DIAGNOSIS — Z20822 Contact with and (suspected) exposure to covid-19: Secondary | ICD-10-CM | POA: Diagnosis not present

## 2020-01-27 DIAGNOSIS — M48062 Spinal stenosis, lumbar region with neurogenic claudication: Secondary | ICD-10-CM | POA: Diagnosis not present

## 2020-01-27 DIAGNOSIS — F1120 Opioid dependence, uncomplicated: Secondary | ICD-10-CM | POA: Diagnosis not present

## 2020-01-27 DIAGNOSIS — M5136 Other intervertebral disc degeneration, lumbar region: Secondary | ICD-10-CM | POA: Diagnosis not present

## 2020-01-27 DIAGNOSIS — M47816 Spondylosis without myelopathy or radiculopathy, lumbar region: Secondary | ICD-10-CM | POA: Diagnosis not present

## 2020-02-02 DIAGNOSIS — J0100 Acute maxillary sinusitis, unspecified: Secondary | ICD-10-CM | POA: Diagnosis not present

## 2020-02-10 DIAGNOSIS — R635 Abnormal weight gain: Secondary | ICD-10-CM | POA: Diagnosis not present

## 2020-02-10 DIAGNOSIS — Z1159 Encounter for screening for other viral diseases: Secondary | ICD-10-CM | POA: Diagnosis not present

## 2020-02-10 DIAGNOSIS — E7800 Pure hypercholesterolemia, unspecified: Secondary | ICD-10-CM | POA: Diagnosis not present

## 2020-02-10 DIAGNOSIS — E559 Vitamin D deficiency, unspecified: Secondary | ICD-10-CM | POA: Diagnosis not present

## 2020-02-10 DIAGNOSIS — Z79899 Other long term (current) drug therapy: Secondary | ICD-10-CM | POA: Diagnosis not present

## 2020-02-15 DIAGNOSIS — M47816 Spondylosis without myelopathy or radiculopathy, lumbar region: Secondary | ICD-10-CM | POA: Diagnosis not present

## 2020-02-15 DIAGNOSIS — E785 Hyperlipidemia, unspecified: Secondary | ICD-10-CM | POA: Diagnosis not present

## 2020-02-15 DIAGNOSIS — F329 Major depressive disorder, single episode, unspecified: Secondary | ICD-10-CM | POA: Diagnosis not present

## 2020-02-15 DIAGNOSIS — N189 Chronic kidney disease, unspecified: Secondary | ICD-10-CM | POA: Diagnosis not present

## 2020-02-24 DIAGNOSIS — F1120 Opioid dependence, uncomplicated: Secondary | ICD-10-CM | POA: Diagnosis not present

## 2020-02-24 DIAGNOSIS — M47816 Spondylosis without myelopathy or radiculopathy, lumbar region: Secondary | ICD-10-CM | POA: Diagnosis not present

## 2020-02-24 DIAGNOSIS — Z79899 Other long term (current) drug therapy: Secondary | ICD-10-CM | POA: Diagnosis not present

## 2020-02-24 DIAGNOSIS — M5136 Other intervertebral disc degeneration, lumbar region: Secondary | ICD-10-CM | POA: Diagnosis not present

## 2020-02-29 DIAGNOSIS — H2513 Age-related nuclear cataract, bilateral: Secondary | ICD-10-CM | POA: Diagnosis not present

## 2020-02-29 DIAGNOSIS — H11002 Unspecified pterygium of left eye: Secondary | ICD-10-CM | POA: Diagnosis not present

## 2020-02-29 DIAGNOSIS — H04123 Dry eye syndrome of bilateral lacrimal glands: Secondary | ICD-10-CM | POA: Diagnosis not present

## 2020-02-29 DIAGNOSIS — H353132 Nonexudative age-related macular degeneration, bilateral, intermediate dry stage: Secondary | ICD-10-CM | POA: Diagnosis not present

## 2020-03-14 DIAGNOSIS — I10 Essential (primary) hypertension: Secondary | ICD-10-CM | POA: Diagnosis not present

## 2020-03-14 DIAGNOSIS — M47816 Spondylosis without myelopathy or radiculopathy, lumbar region: Secondary | ICD-10-CM | POA: Diagnosis not present

## 2020-04-07 DIAGNOSIS — R5383 Other fatigue: Secondary | ICD-10-CM | POA: Diagnosis not present

## 2020-04-07 DIAGNOSIS — Z79899 Other long term (current) drug therapy: Secondary | ICD-10-CM | POA: Diagnosis not present

## 2020-04-07 DIAGNOSIS — R635 Abnormal weight gain: Secondary | ICD-10-CM | POA: Diagnosis not present

## 2020-04-13 DIAGNOSIS — M47816 Spondylosis without myelopathy or radiculopathy, lumbar region: Secondary | ICD-10-CM | POA: Diagnosis not present

## 2020-04-13 DIAGNOSIS — F329 Major depressive disorder, single episode, unspecified: Secondary | ICD-10-CM | POA: Diagnosis not present

## 2020-04-13 DIAGNOSIS — N189 Chronic kidney disease, unspecified: Secondary | ICD-10-CM | POA: Diagnosis not present

## 2020-04-13 DIAGNOSIS — E785 Hyperlipidemia, unspecified: Secondary | ICD-10-CM | POA: Diagnosis not present

## 2020-04-17 DIAGNOSIS — M47816 Spondylosis without myelopathy or radiculopathy, lumbar region: Secondary | ICD-10-CM | POA: Diagnosis not present

## 2020-04-17 DIAGNOSIS — Z6829 Body mass index (BMI) 29.0-29.9, adult: Secondary | ICD-10-CM | POA: Diagnosis not present

## 2020-04-17 DIAGNOSIS — I10 Essential (primary) hypertension: Secondary | ICD-10-CM | POA: Diagnosis not present

## 2020-04-25 DIAGNOSIS — Z0000 Encounter for general adult medical examination without abnormal findings: Secondary | ICD-10-CM | POA: Diagnosis not present

## 2020-04-25 DIAGNOSIS — Z79899 Other long term (current) drug therapy: Secondary | ICD-10-CM | POA: Diagnosis not present

## 2020-04-25 DIAGNOSIS — Z1159 Encounter for screening for other viral diseases: Secondary | ICD-10-CM | POA: Diagnosis not present

## 2020-04-25 DIAGNOSIS — K219 Gastro-esophageal reflux disease without esophagitis: Secondary | ICD-10-CM | POA: Diagnosis not present

## 2020-04-25 DIAGNOSIS — Z23 Encounter for immunization: Secondary | ICD-10-CM | POA: Diagnosis not present

## 2020-04-25 DIAGNOSIS — R7301 Impaired fasting glucose: Secondary | ICD-10-CM | POA: Diagnosis not present

## 2020-04-25 DIAGNOSIS — N189 Chronic kidney disease, unspecified: Secondary | ICD-10-CM | POA: Diagnosis not present

## 2020-04-25 DIAGNOSIS — E785 Hyperlipidemia, unspecified: Secondary | ICD-10-CM | POA: Diagnosis not present

## 2020-04-25 DIAGNOSIS — M47816 Spondylosis without myelopathy or radiculopathy, lumbar region: Secondary | ICD-10-CM | POA: Diagnosis not present

## 2020-05-02 DIAGNOSIS — M47816 Spondylosis without myelopathy or radiculopathy, lumbar region: Secondary | ICD-10-CM | POA: Diagnosis not present

## 2020-05-07 DIAGNOSIS — J22 Unspecified acute lower respiratory infection: Secondary | ICD-10-CM | POA: Diagnosis not present

## 2020-05-07 DIAGNOSIS — J019 Acute sinusitis, unspecified: Secondary | ICD-10-CM | POA: Diagnosis not present

## 2020-05-07 DIAGNOSIS — J309 Allergic rhinitis, unspecified: Secondary | ICD-10-CM | POA: Diagnosis not present

## 2020-05-17 DIAGNOSIS — F329 Major depressive disorder, single episode, unspecified: Secondary | ICD-10-CM | POA: Diagnosis not present

## 2020-05-17 DIAGNOSIS — N189 Chronic kidney disease, unspecified: Secondary | ICD-10-CM | POA: Diagnosis not present

## 2020-05-17 DIAGNOSIS — E785 Hyperlipidemia, unspecified: Secondary | ICD-10-CM | POA: Diagnosis not present

## 2020-05-17 DIAGNOSIS — M47816 Spondylosis without myelopathy or radiculopathy, lumbar region: Secondary | ICD-10-CM | POA: Diagnosis not present

## 2020-05-28 DIAGNOSIS — Z6829 Body mass index (BMI) 29.0-29.9, adult: Secondary | ICD-10-CM | POA: Diagnosis not present

## 2020-05-28 DIAGNOSIS — M48062 Spinal stenosis, lumbar region with neurogenic claudication: Secondary | ICD-10-CM | POA: Diagnosis not present

## 2020-05-28 DIAGNOSIS — M47816 Spondylosis without myelopathy or radiculopathy, lumbar region: Secondary | ICD-10-CM | POA: Diagnosis not present

## 2020-05-28 DIAGNOSIS — M5416 Radiculopathy, lumbar region: Secondary | ICD-10-CM | POA: Diagnosis not present

## 2020-06-02 DIAGNOSIS — J019 Acute sinusitis, unspecified: Secondary | ICD-10-CM | POA: Diagnosis not present

## 2020-06-02 DIAGNOSIS — J029 Acute pharyngitis, unspecified: Secondary | ICD-10-CM | POA: Diagnosis not present

## 2020-06-02 DIAGNOSIS — J209 Acute bronchitis, unspecified: Secondary | ICD-10-CM | POA: Diagnosis not present

## 2020-06-05 DIAGNOSIS — J189 Pneumonia, unspecified organism: Secondary | ICD-10-CM | POA: Diagnosis not present

## 2020-06-05 DIAGNOSIS — Z20828 Contact with and (suspected) exposure to other viral communicable diseases: Secondary | ICD-10-CM | POA: Diagnosis not present

## 2020-06-19 DIAGNOSIS — N189 Chronic kidney disease, unspecified: Secondary | ICD-10-CM | POA: Diagnosis not present

## 2020-06-19 DIAGNOSIS — F329 Major depressive disorder, single episode, unspecified: Secondary | ICD-10-CM | POA: Diagnosis not present

## 2020-06-19 DIAGNOSIS — E785 Hyperlipidemia, unspecified: Secondary | ICD-10-CM | POA: Diagnosis not present

## 2020-06-19 DIAGNOSIS — M47816 Spondylosis without myelopathy or radiculopathy, lumbar region: Secondary | ICD-10-CM | POA: Diagnosis not present

## 2020-06-20 DIAGNOSIS — M461 Sacroiliitis, not elsewhere classified: Secondary | ICD-10-CM | POA: Diagnosis not present

## 2020-06-22 DIAGNOSIS — F329 Major depressive disorder, single episode, unspecified: Secondary | ICD-10-CM | POA: Diagnosis not present

## 2020-06-22 DIAGNOSIS — N189 Chronic kidney disease, unspecified: Secondary | ICD-10-CM | POA: Diagnosis not present

## 2020-06-22 DIAGNOSIS — M47816 Spondylosis without myelopathy or radiculopathy, lumbar region: Secondary | ICD-10-CM | POA: Diagnosis not present

## 2020-06-22 DIAGNOSIS — E785 Hyperlipidemia, unspecified: Secondary | ICD-10-CM | POA: Diagnosis not present

## 2020-06-28 DIAGNOSIS — R05 Cough: Secondary | ICD-10-CM | POA: Diagnosis not present

## 2020-06-28 DIAGNOSIS — K219 Gastro-esophageal reflux disease without esophagitis: Secondary | ICD-10-CM | POA: Diagnosis not present

## 2020-06-30 DIAGNOSIS — R635 Abnormal weight gain: Secondary | ICD-10-CM | POA: Diagnosis not present

## 2020-07-26 DIAGNOSIS — K219 Gastro-esophageal reflux disease without esophagitis: Secondary | ICD-10-CM | POA: Diagnosis not present

## 2020-07-26 DIAGNOSIS — K921 Melena: Secondary | ICD-10-CM | POA: Diagnosis not present

## 2020-07-27 DIAGNOSIS — R635 Abnormal weight gain: Secondary | ICD-10-CM | POA: Diagnosis not present

## 2020-07-27 DIAGNOSIS — R5383 Other fatigue: Secondary | ICD-10-CM | POA: Diagnosis not present

## 2020-08-16 DIAGNOSIS — F329 Major depressive disorder, single episode, unspecified: Secondary | ICD-10-CM | POA: Diagnosis not present

## 2020-08-16 DIAGNOSIS — N189 Chronic kidney disease, unspecified: Secondary | ICD-10-CM | POA: Diagnosis not present

## 2020-08-16 DIAGNOSIS — E785 Hyperlipidemia, unspecified: Secondary | ICD-10-CM | POA: Diagnosis not present

## 2020-08-16 DIAGNOSIS — M47816 Spondylosis without myelopathy or radiculopathy, lumbar region: Secondary | ICD-10-CM | POA: Diagnosis not present

## 2020-08-22 DIAGNOSIS — R1310 Dysphagia, unspecified: Secondary | ICD-10-CM | POA: Diagnosis not present

## 2020-08-22 DIAGNOSIS — K219 Gastro-esophageal reflux disease without esophagitis: Secondary | ICD-10-CM | POA: Diagnosis not present

## 2020-08-22 DIAGNOSIS — R1013 Epigastric pain: Secondary | ICD-10-CM | POA: Diagnosis not present

## 2020-08-22 DIAGNOSIS — N189 Chronic kidney disease, unspecified: Secondary | ICD-10-CM | POA: Diagnosis not present

## 2020-08-23 ENCOUNTER — Other Ambulatory Visit: Payer: Self-pay | Admitting: Physician Assistant

## 2020-08-23 DIAGNOSIS — K219 Gastro-esophageal reflux disease without esophagitis: Secondary | ICD-10-CM

## 2020-08-29 DIAGNOSIS — H524 Presbyopia: Secondary | ICD-10-CM | POA: Diagnosis not present

## 2020-08-29 DIAGNOSIS — H353132 Nonexudative age-related macular degeneration, bilateral, intermediate dry stage: Secondary | ICD-10-CM | POA: Diagnosis not present

## 2020-08-29 DIAGNOSIS — H2513 Age-related nuclear cataract, bilateral: Secondary | ICD-10-CM | POA: Diagnosis not present

## 2020-08-29 DIAGNOSIS — H5203 Hypermetropia, bilateral: Secondary | ICD-10-CM | POA: Diagnosis not present

## 2020-08-29 DIAGNOSIS — H04123 Dry eye syndrome of bilateral lacrimal glands: Secondary | ICD-10-CM | POA: Diagnosis not present

## 2020-08-29 DIAGNOSIS — H52223 Regular astigmatism, bilateral: Secondary | ICD-10-CM | POA: Diagnosis not present

## 2020-08-30 ENCOUNTER — Ambulatory Visit
Admission: RE | Admit: 2020-08-30 | Discharge: 2020-08-30 | Disposition: A | Discharge status: Home / Self Care | Payer: PPO | Source: Ambulatory Visit | Attending: Physician Assistant | Admitting: Physician Assistant

## 2020-08-30 DIAGNOSIS — R1310 Dysphagia, unspecified: Secondary | ICD-10-CM | POA: Diagnosis not present

## 2020-08-30 DIAGNOSIS — K219 Gastro-esophageal reflux disease without esophagitis: Secondary | ICD-10-CM

## 2020-09-11 DIAGNOSIS — Z1159 Encounter for screening for other viral diseases: Secondary | ICD-10-CM | POA: Diagnosis not present

## 2020-09-14 DIAGNOSIS — K317 Polyp of stomach and duodenum: Secondary | ICD-10-CM | POA: Diagnosis not present

## 2020-09-14 DIAGNOSIS — K3189 Other diseases of stomach and duodenum: Secondary | ICD-10-CM | POA: Diagnosis not present

## 2020-09-14 DIAGNOSIS — K219 Gastro-esophageal reflux disease without esophagitis: Secondary | ICD-10-CM | POA: Diagnosis not present

## 2020-09-14 DIAGNOSIS — R1310 Dysphagia, unspecified: Secondary | ICD-10-CM | POA: Diagnosis not present

## 2020-09-14 DIAGNOSIS — K228 Other specified diseases of esophagus: Secondary | ICD-10-CM | POA: Diagnosis not present

## 2020-09-18 DIAGNOSIS — D485 Neoplasm of uncertain behavior of skin: Secondary | ICD-10-CM | POA: Diagnosis not present

## 2020-09-18 DIAGNOSIS — L821 Other seborrheic keratosis: Secondary | ICD-10-CM | POA: Diagnosis not present

## 2020-09-18 DIAGNOSIS — D1801 Hemangioma of skin and subcutaneous tissue: Secondary | ICD-10-CM | POA: Diagnosis not present

## 2020-09-18 DIAGNOSIS — D2239 Melanocytic nevi of other parts of face: Secondary | ICD-10-CM | POA: Diagnosis not present

## 2020-09-18 DIAGNOSIS — D225 Melanocytic nevi of trunk: Secondary | ICD-10-CM | POA: Diagnosis not present

## 2020-09-20 DIAGNOSIS — K228 Other specified diseases of esophagus: Secondary | ICD-10-CM | POA: Diagnosis not present

## 2020-09-20 DIAGNOSIS — F329 Major depressive disorder, single episode, unspecified: Secondary | ICD-10-CM | POA: Diagnosis not present

## 2020-09-20 DIAGNOSIS — N189 Chronic kidney disease, unspecified: Secondary | ICD-10-CM | POA: Diagnosis not present

## 2020-09-20 DIAGNOSIS — M47816 Spondylosis without myelopathy or radiculopathy, lumbar region: Secondary | ICD-10-CM | POA: Diagnosis not present

## 2020-09-20 DIAGNOSIS — K3189 Other diseases of stomach and duodenum: Secondary | ICD-10-CM | POA: Diagnosis not present

## 2020-09-20 DIAGNOSIS — E785 Hyperlipidemia, unspecified: Secondary | ICD-10-CM | POA: Diagnosis not present

## 2020-09-24 DIAGNOSIS — J309 Allergic rhinitis, unspecified: Secondary | ICD-10-CM | POA: Diagnosis not present

## 2020-09-24 DIAGNOSIS — K219 Gastro-esophageal reflux disease without esophagitis: Secondary | ICD-10-CM | POA: Diagnosis not present

## 2020-09-24 DIAGNOSIS — Z1211 Encounter for screening for malignant neoplasm of colon: Secondary | ICD-10-CM | POA: Diagnosis not present

## 2020-10-15 DIAGNOSIS — F329 Major depressive disorder, single episode, unspecified: Secondary | ICD-10-CM | POA: Diagnosis not present

## 2020-10-15 DIAGNOSIS — N189 Chronic kidney disease, unspecified: Secondary | ICD-10-CM | POA: Diagnosis not present

## 2020-10-15 DIAGNOSIS — E785 Hyperlipidemia, unspecified: Secondary | ICD-10-CM | POA: Diagnosis not present

## 2020-10-15 DIAGNOSIS — M47816 Spondylosis without myelopathy or radiculopathy, lumbar region: Secondary | ICD-10-CM | POA: Diagnosis not present

## 2020-11-08 DIAGNOSIS — S6991XA Unspecified injury of right wrist, hand and finger(s), initial encounter: Secondary | ICD-10-CM | POA: Diagnosis not present

## 2020-11-12 DIAGNOSIS — N189 Chronic kidney disease, unspecified: Secondary | ICD-10-CM | POA: Diagnosis not present

## 2020-11-12 DIAGNOSIS — K219 Gastro-esophageal reflux disease without esophagitis: Secondary | ICD-10-CM | POA: Diagnosis not present

## 2020-11-12 DIAGNOSIS — M47816 Spondylosis without myelopathy or radiculopathy, lumbar region: Secondary | ICD-10-CM | POA: Diagnosis not present

## 2020-11-12 DIAGNOSIS — E785 Hyperlipidemia, unspecified: Secondary | ICD-10-CM | POA: Diagnosis not present

## 2020-11-12 DIAGNOSIS — F329 Major depressive disorder, single episode, unspecified: Secondary | ICD-10-CM | POA: Diagnosis not present

## 2020-11-21 DIAGNOSIS — M25531 Pain in right wrist: Secondary | ICD-10-CM | POA: Diagnosis not present

## 2020-12-18 DIAGNOSIS — N189 Chronic kidney disease, unspecified: Secondary | ICD-10-CM | POA: Diagnosis not present

## 2020-12-18 DIAGNOSIS — E785 Hyperlipidemia, unspecified: Secondary | ICD-10-CM | POA: Diagnosis not present

## 2020-12-18 DIAGNOSIS — M47816 Spondylosis without myelopathy or radiculopathy, lumbar region: Secondary | ICD-10-CM | POA: Diagnosis not present

## 2020-12-18 DIAGNOSIS — F329 Major depressive disorder, single episode, unspecified: Secondary | ICD-10-CM | POA: Diagnosis not present

## 2020-12-18 DIAGNOSIS — K219 Gastro-esophageal reflux disease without esophagitis: Secondary | ICD-10-CM | POA: Diagnosis not present

## 2021-01-09 DIAGNOSIS — N183 Chronic kidney disease, stage 3 unspecified: Secondary | ICD-10-CM | POA: Diagnosis not present

## 2021-01-09 DIAGNOSIS — R519 Headache, unspecified: Secondary | ICD-10-CM | POA: Diagnosis not present

## 2021-01-09 DIAGNOSIS — Z20828 Contact with and (suspected) exposure to other viral communicable diseases: Secondary | ICD-10-CM | POA: Diagnosis not present

## 2021-01-09 DIAGNOSIS — R509 Fever, unspecified: Secondary | ICD-10-CM | POA: Diagnosis not present

## 2021-01-11 DIAGNOSIS — M47816 Spondylosis without myelopathy or radiculopathy, lumbar region: Secondary | ICD-10-CM | POA: Diagnosis not present

## 2021-01-11 DIAGNOSIS — F329 Major depressive disorder, single episode, unspecified: Secondary | ICD-10-CM | POA: Diagnosis not present

## 2021-01-11 DIAGNOSIS — N189 Chronic kidney disease, unspecified: Secondary | ICD-10-CM | POA: Diagnosis not present

## 2021-01-11 DIAGNOSIS — K219 Gastro-esophageal reflux disease without esophagitis: Secondary | ICD-10-CM | POA: Diagnosis not present

## 2021-01-11 DIAGNOSIS — E785 Hyperlipidemia, unspecified: Secondary | ICD-10-CM | POA: Diagnosis not present

## 2021-01-16 DIAGNOSIS — R0981 Nasal congestion: Secondary | ICD-10-CM | POA: Diagnosis not present

## 2021-01-16 DIAGNOSIS — R051 Acute cough: Secondary | ICD-10-CM | POA: Diagnosis not present

## 2021-01-23 DIAGNOSIS — Z6830 Body mass index (BMI) 30.0-30.9, adult: Secondary | ICD-10-CM | POA: Diagnosis not present

## 2021-01-23 DIAGNOSIS — I10 Essential (primary) hypertension: Secondary | ICD-10-CM | POA: Diagnosis not present

## 2021-01-23 DIAGNOSIS — M461 Sacroiliitis, not elsewhere classified: Secondary | ICD-10-CM | POA: Diagnosis not present

## 2021-03-11 DIAGNOSIS — H11002 Unspecified pterygium of left eye: Secondary | ICD-10-CM | POA: Diagnosis not present

## 2021-03-11 DIAGNOSIS — H2513 Age-related nuclear cataract, bilateral: Secondary | ICD-10-CM | POA: Diagnosis not present

## 2021-03-11 DIAGNOSIS — H353132 Nonexudative age-related macular degeneration, bilateral, intermediate dry stage: Secondary | ICD-10-CM | POA: Diagnosis not present

## 2021-03-14 DIAGNOSIS — J069 Acute upper respiratory infection, unspecified: Secondary | ICD-10-CM | POA: Diagnosis not present

## 2021-03-15 DIAGNOSIS — M47816 Spondylosis without myelopathy or radiculopathy, lumbar region: Secondary | ICD-10-CM | POA: Diagnosis not present

## 2021-03-15 DIAGNOSIS — N189 Chronic kidney disease, unspecified: Secondary | ICD-10-CM | POA: Diagnosis not present

## 2021-03-15 DIAGNOSIS — E785 Hyperlipidemia, unspecified: Secondary | ICD-10-CM | POA: Diagnosis not present

## 2021-03-15 DIAGNOSIS — K219 Gastro-esophageal reflux disease without esophagitis: Secondary | ICD-10-CM | POA: Diagnosis not present

## 2021-03-15 DIAGNOSIS — F329 Major depressive disorder, single episode, unspecified: Secondary | ICD-10-CM | POA: Diagnosis not present

## 2021-05-07 DIAGNOSIS — E559 Vitamin D deficiency, unspecified: Secondary | ICD-10-CM | POA: Diagnosis not present

## 2021-05-07 DIAGNOSIS — R635 Abnormal weight gain: Secondary | ICD-10-CM | POA: Diagnosis not present

## 2021-05-07 DIAGNOSIS — Z79899 Other long term (current) drug therapy: Secondary | ICD-10-CM | POA: Diagnosis not present

## 2021-05-07 DIAGNOSIS — Z20822 Contact with and (suspected) exposure to covid-19: Secondary | ICD-10-CM | POA: Diagnosis not present

## 2021-05-07 DIAGNOSIS — E669 Obesity, unspecified: Secondary | ICD-10-CM | POA: Diagnosis not present

## 2021-05-07 DIAGNOSIS — E8881 Metabolic syndrome: Secondary | ICD-10-CM | POA: Diagnosis not present

## 2021-05-07 DIAGNOSIS — Z1331 Encounter for screening for depression: Secondary | ICD-10-CM | POA: Diagnosis not present

## 2021-05-07 DIAGNOSIS — R0602 Shortness of breath: Secondary | ICD-10-CM | POA: Diagnosis not present

## 2021-05-07 DIAGNOSIS — E349 Endocrine disorder, unspecified: Secondary | ICD-10-CM | POA: Diagnosis not present

## 2021-05-07 DIAGNOSIS — Z131 Encounter for screening for diabetes mellitus: Secondary | ICD-10-CM | POA: Diagnosis not present

## 2021-05-07 DIAGNOSIS — D539 Nutritional anemia, unspecified: Secondary | ICD-10-CM | POA: Diagnosis not present

## 2021-05-07 DIAGNOSIS — E7800 Pure hypercholesterolemia, unspecified: Secondary | ICD-10-CM | POA: Diagnosis not present

## 2021-05-07 DIAGNOSIS — R5383 Other fatigue: Secondary | ICD-10-CM | POA: Diagnosis not present

## 2021-05-14 DIAGNOSIS — N189 Chronic kidney disease, unspecified: Secondary | ICD-10-CM | POA: Diagnosis not present

## 2021-05-14 DIAGNOSIS — K219 Gastro-esophageal reflux disease without esophagitis: Secondary | ICD-10-CM | POA: Diagnosis not present

## 2021-05-14 DIAGNOSIS — M47816 Spondylosis without myelopathy or radiculopathy, lumbar region: Secondary | ICD-10-CM | POA: Diagnosis not present

## 2021-05-14 DIAGNOSIS — F329 Major depressive disorder, single episode, unspecified: Secondary | ICD-10-CM | POA: Diagnosis not present

## 2021-05-14 DIAGNOSIS — E785 Hyperlipidemia, unspecified: Secondary | ICD-10-CM | POA: Diagnosis not present

## 2021-06-05 DIAGNOSIS — E7800 Pure hypercholesterolemia, unspecified: Secondary | ICD-10-CM | POA: Diagnosis not present

## 2021-06-05 DIAGNOSIS — E669 Obesity, unspecified: Secondary | ICD-10-CM | POA: Diagnosis not present

## 2021-06-05 DIAGNOSIS — E559 Vitamin D deficiency, unspecified: Secondary | ICD-10-CM | POA: Diagnosis not present

## 2021-06-05 DIAGNOSIS — R635 Abnormal weight gain: Secondary | ICD-10-CM | POA: Diagnosis not present

## 2021-06-11 DIAGNOSIS — N189 Chronic kidney disease, unspecified: Secondary | ICD-10-CM | POA: Diagnosis not present

## 2021-06-11 DIAGNOSIS — K219 Gastro-esophageal reflux disease without esophagitis: Secondary | ICD-10-CM | POA: Diagnosis not present

## 2021-06-11 DIAGNOSIS — F329 Major depressive disorder, single episode, unspecified: Secondary | ICD-10-CM | POA: Diagnosis not present

## 2021-06-11 DIAGNOSIS — E785 Hyperlipidemia, unspecified: Secondary | ICD-10-CM | POA: Diagnosis not present

## 2021-06-11 DIAGNOSIS — M47816 Spondylosis without myelopathy or radiculopathy, lumbar region: Secondary | ICD-10-CM | POA: Diagnosis not present

## 2021-07-04 DIAGNOSIS — R200 Anesthesia of skin: Secondary | ICD-10-CM | POA: Diagnosis not present

## 2021-07-04 DIAGNOSIS — Z981 Arthrodesis status: Secondary | ICD-10-CM | POA: Diagnosis not present

## 2021-07-08 ENCOUNTER — Other Ambulatory Visit: Payer: Self-pay | Admitting: Student

## 2021-07-08 DIAGNOSIS — R200 Anesthesia of skin: Secondary | ICD-10-CM

## 2021-07-09 DIAGNOSIS — N189 Chronic kidney disease, unspecified: Secondary | ICD-10-CM | POA: Diagnosis not present

## 2021-07-09 DIAGNOSIS — R809 Proteinuria, unspecified: Secondary | ICD-10-CM | POA: Diagnosis not present

## 2021-07-09 DIAGNOSIS — N183 Chronic kidney disease, stage 3 unspecified: Secondary | ICD-10-CM | POA: Diagnosis not present

## 2021-07-09 DIAGNOSIS — I10 Essential (primary) hypertension: Secondary | ICD-10-CM | POA: Diagnosis not present

## 2021-07-09 DIAGNOSIS — R309 Painful micturition, unspecified: Secondary | ICD-10-CM | POA: Diagnosis not present

## 2021-07-09 DIAGNOSIS — D649 Anemia, unspecified: Secondary | ICD-10-CM | POA: Diagnosis not present

## 2021-07-09 DIAGNOSIS — E559 Vitamin D deficiency, unspecified: Secondary | ICD-10-CM | POA: Diagnosis not present

## 2021-07-09 DIAGNOSIS — E669 Obesity, unspecified: Secondary | ICD-10-CM | POA: Diagnosis not present

## 2021-07-09 DIAGNOSIS — E7849 Other hyperlipidemia: Secondary | ICD-10-CM | POA: Diagnosis not present

## 2021-07-11 DIAGNOSIS — K219 Gastro-esophageal reflux disease without esophagitis: Secondary | ICD-10-CM | POA: Diagnosis not present

## 2021-07-11 DIAGNOSIS — M47816 Spondylosis without myelopathy or radiculopathy, lumbar region: Secondary | ICD-10-CM | POA: Diagnosis not present

## 2021-07-11 DIAGNOSIS — E785 Hyperlipidemia, unspecified: Secondary | ICD-10-CM | POA: Diagnosis not present

## 2021-07-11 DIAGNOSIS — F329 Major depressive disorder, single episode, unspecified: Secondary | ICD-10-CM | POA: Diagnosis not present

## 2021-07-11 DIAGNOSIS — N189 Chronic kidney disease, unspecified: Secondary | ICD-10-CM | POA: Diagnosis not present

## 2021-07-14 ENCOUNTER — Ambulatory Visit
Admission: RE | Admit: 2021-07-14 | Discharge: 2021-07-14 | Disposition: A | Discharge status: Home / Self Care | Payer: PPO | Source: Ambulatory Visit | Attending: Student | Admitting: Student

## 2021-07-14 DIAGNOSIS — M4802 Spinal stenosis, cervical region: Secondary | ICD-10-CM | POA: Diagnosis not present

## 2021-07-14 DIAGNOSIS — R200 Anesthesia of skin: Secondary | ICD-10-CM

## 2021-07-16 DIAGNOSIS — E7800 Pure hypercholesterolemia, unspecified: Secondary | ICD-10-CM | POA: Diagnosis not present

## 2021-07-16 DIAGNOSIS — E559 Vitamin D deficiency, unspecified: Secondary | ICD-10-CM | POA: Diagnosis not present

## 2021-07-16 DIAGNOSIS — E669 Obesity, unspecified: Secondary | ICD-10-CM | POA: Diagnosis not present

## 2021-07-16 DIAGNOSIS — R635 Abnormal weight gain: Secondary | ICD-10-CM | POA: Diagnosis not present

## 2021-07-24 DIAGNOSIS — Z6830 Body mass index (BMI) 30.0-30.9, adult: Secondary | ICD-10-CM | POA: Diagnosis not present

## 2021-07-24 DIAGNOSIS — R200 Anesthesia of skin: Secondary | ICD-10-CM | POA: Diagnosis not present

## 2021-07-25 ENCOUNTER — Other Ambulatory Visit (HOSPITAL_COMMUNITY): Payer: Self-pay | Admitting: Neurosurgery

## 2021-07-25 DIAGNOSIS — R200 Anesthesia of skin: Secondary | ICD-10-CM

## 2021-07-30 ENCOUNTER — Inpatient Hospital Stay (HOSPITAL_COMMUNITY): Admission: RE | Admit: 2021-07-30 | Payer: PPO | Source: Ambulatory Visit

## 2021-08-09 DIAGNOSIS — M47816 Spondylosis without myelopathy or radiculopathy, lumbar region: Secondary | ICD-10-CM | POA: Diagnosis not present

## 2021-08-09 DIAGNOSIS — F329 Major depressive disorder, single episode, unspecified: Secondary | ICD-10-CM | POA: Diagnosis not present

## 2021-08-09 DIAGNOSIS — E785 Hyperlipidemia, unspecified: Secondary | ICD-10-CM | POA: Diagnosis not present

## 2021-08-09 DIAGNOSIS — K219 Gastro-esophageal reflux disease without esophagitis: Secondary | ICD-10-CM | POA: Diagnosis not present

## 2021-08-09 DIAGNOSIS — N189 Chronic kidney disease, unspecified: Secondary | ICD-10-CM | POA: Diagnosis not present

## 2021-08-12 ENCOUNTER — Ambulatory Visit (HOSPITAL_COMMUNITY)
Admission: RE | Admit: 2021-08-12 | Discharge: 2021-08-12 | Disposition: A | Discharge status: Home / Self Care | Payer: PPO | Source: Ambulatory Visit | Attending: Surgery | Admitting: Surgery

## 2021-08-12 DIAGNOSIS — R200 Anesthesia of skin: Secondary | ICD-10-CM | POA: Insufficient documentation

## 2021-08-13 ENCOUNTER — Other Ambulatory Visit: Payer: Self-pay

## 2021-08-15 DIAGNOSIS — E669 Obesity, unspecified: Secondary | ICD-10-CM | POA: Diagnosis not present

## 2021-08-15 DIAGNOSIS — R635 Abnormal weight gain: Secondary | ICD-10-CM | POA: Diagnosis not present

## 2021-08-15 DIAGNOSIS — E559 Vitamin D deficiency, unspecified: Secondary | ICD-10-CM | POA: Diagnosis not present

## 2021-08-15 DIAGNOSIS — E7800 Pure hypercholesterolemia, unspecified: Secondary | ICD-10-CM | POA: Diagnosis not present

## 2021-08-24 ENCOUNTER — Telehealth: Payer: PPO | Admitting: Physician Assistant

## 2021-08-24 DIAGNOSIS — M79674 Pain in right toe(s): Secondary | ICD-10-CM | POA: Diagnosis not present

## 2021-08-24 MED ORDER — COLCHICINE 0.6 MG PO TABS: ORAL_TABLET | ORAL | 0 refills | Status: DC

## 2021-08-24 NOTE — Progress Notes (Signed)
Virtual Visit via Video Note  I connected with Briana Wolf on 08/24/21 at  7:00 PM EDT by a video enabled telemedicine application and verified that I am speaking with the correct person using two identifiers.  Location: Patient: Patient's home Provider: Provider's office  Person participating in the virtual visit: Patient and provider    I discussed the limitations of evaluation and management by telemedicine and the availability of in person appointments. The patient expressed understanding and agreed to proceed.  I discussed the assessment and treatment plan with the patient. The patient was provided an opportunity to ask questions and all were answered. The patient agreed with the plan and demonstrated an understanding of the instructions.   The patient was advised to call back or seek an in-person evaluation if the symptoms worsen or if the condition fails to improve as anticipated.  I provided 15 minutes of non-face-to-face time during this encounter.   Briana Merl, PA-C   Subjective:    Patient ID: Briana Wolf, female    DOB: [birth year], 70 y.o.   MRN: [id number]  No chief complaint on file.   70 yo F in NAD with PMH of CKD, connects via video and complains of possible "gout". States she developed redness, swelling and pain over the MTP joint of the R great toe x 3 days. The pain is worse if "anything touches it"/weightbearing. Denies any injury in the area. Had previous surgery in the toe approx 7 years ago for bunion surgery- denies any deficits/pain after surgery. Pt states last GFT was 45 and was 3 months ago.  Other Associated symptoms include arthralgias and joint swelling. Pertinent negatives include no abdominal pain, chest pain, chills, fever, nausea, rash or vomiting.  Patient is in today for gout flare  Past Medical History:  Diagnosis Date   Arthritis    Contact lens/glasses fitting    wears contacts or glasses at times   Coughing    gets cough  from dry throat   Depression    Family history of anesthesia complication    mom allergic-to many meds-multiple medical problems   GERD (gastroesophageal reflux disease)    Hyperlipidemia    Hypertension    Renal insufficiency    Dr Brayton El in [place] of right rotator cuff 08/05/2013    Past Surgical History:  Procedure Laterality Date   BACK SURGERY  2008   cervical fusion    [place]   lumb disk   CARPAL TUNNEL RELEASE  1987   left and right wrist    COLONOSCOPY     DILATION AND CURETTAGE OF UTERUS  2006   ELBOW ARTHROSCOPY     ulnar nerve-rt   FOOT SURGERY  2005   bilateral    LUMBAR LAMINECTOMY/DECOMPRESSION MICRODISCECTOMY Left 06/06/2014   Procedure: LUMBAR LAMINECTOMY/DECOMPRESSION MICRODISCECTOMY 1 LEVEL LEFT LUMBAR THREE-FOUR;  Surgeon: Floyce Stakes, MD;  Location: [facility];  Service: Neurosurgery;  Laterality: Left;   [place]   let shoulder    SHOULDER ARTHROSCOPY WITH ROTATOR CUFF REPAIR Right 01/20/2014   Procedure: RIGHT SHOULDER ARTHROSCOPY WITH EXTENSIVE  DEBRIDEMENT, REVISION  ROTATOR CUFF REPAIR AND BICEPS TENOTOMY;  Surgeon: Johnny Bridge, MD;  Location: [place];  Service: Orthopedics;  Laterality: Right;   SHOULDER ARTHROSCOPY WITH ROTATOR CUFF REPAIR AND SUBACROMIAL DECOMPRESSION Right 08/05/2013   Procedure: RIGHT SHOULDER ARTHROSCOPY WITH EXTENSIVE DEBRIDEMENT, ROTATOR CUFF REPAIR AND SUBACROMIAL DECOMPRESSION;  Surgeon:  Johnny Bridge, MD;  Location: [facility];  Service: Orthopedics;  Laterality: Right;   TONSILLECTOMY     TUBAL LIGATION  1989    Family History  Problem Relation Age of Onset   Diabetes Mother    Heart disease Father     Social History   Socioeconomic History   Marital status: Married    Spouse name: Not on file   Number of children: Not on file   Years of education: Not on file   Highest education level: Not on file  Occupational History   Not on  file  Tobacco Use   Smoking status: Never   Smokeless tobacco: Never  Substance and Sexual Activity   Alcohol use: Yes    Comment: wine rarely   Drug use: No   Sexual activity: Not on file  Other Topics Concern   Not on file  Social History Narrative   Not on file   Social Determinants of Health   Financial Resource Strain: Not on file  Food Insecurity: Not on file  Transportation Needs: Not on file  Physical Activity: Not on file  Stress: Not on file  Social Connections: Not on file  Intimate Partner Violence: Not on file    Outpatient Medications Prior to Visit  Medication Sig Dispense Refill   benzonatate (TESSALON) 100 MG capsule Take 100 mg by mouth 3 (three) times daily as needed.     buPROPion (WELLBUTRIN XL) 150 MG 24 hr tablet Take 450 mg by mouth daily.      FLUoxetine (PROZAC) 20 MG tablet Take 20 mg by mouth at bedtime.      losartan (COZAAR) 50 MG tablet Take 50 mg by mouth daily.      Pantoprazole Sodium (PROTONIX PO) Take 1 tablet by mouth daily.     phentermine 37.5 MG capsule Take 37.5 mg by mouth daily.     traZODone (DESYREL) 100 MG tablet Take 50 mg by mouth at bedtime as needed for sleep.     triamterene-hydrochlorothiazide (MAXZIDE-25) 37.5-25 MG per tablet Take 1 tablet by mouth daily.      No facility-administered medications prior to visit.    Allergies  Allergen Reactions   Codeine Nausea Only    Makes pt nauseous    Nsaids     Decreasing GFR, told to not take NSAIDS again    Review of Systems  Constitutional:  Negative for chills and fever.  Respiratory:  Negative for shortness of breath.   Cardiovascular:  Negative for chest pain.  Gastrointestinal:  Negative for abdominal pain, nausea and vomiting.  Musculoskeletal:  Positive for arthralgias and joint swelling.  Skin:  Negative for color change, pallor and rash.  Neurological:  Negative for dizziness.  Psychiatric/Behavioral:  Negative for confusion.       Objective:    Physical  Exam Constitutional:      General: She is not in acute distress.    Appearance: Normal appearance. She is not ill-appearing, toxic-appearing or diaphoretic.  Pulmonary:     Effort: No respiratory distress.  Musculoskeletal:     Comments: Redness/swelling on the MTP of the R bigger toe. Pain when patient touches the joint on video  Neurological:     Mental Status: She is alert and oriented to person, place, and time.  Psychiatric:        Mood and Affect: Mood normal.        Behavior: Behavior normal.        Thought Content:  Thought content normal.        Judgment: Judgment normal.    There were no vitals taken for this visit. Wt Readings from Last 3 Encounters:  01/18/20 161 lb 12.8 oz (73.4 kg)  10/18/19 156 lb 9.6 oz (71 kg)  06/13/15 153 lb (69.4 kg)    Health Maintenance Due  Topic Date Due   COVID-19 Vaccine (1) Never done   Hepatitis C Screening  Never done   TETANUS/TDAP  Never done   Zoster Vaccines- Shingrix (1 of 2) Never done   COLONOSCOPY (Pts 45-43yr Insurance coverage will need to be confirmed)  Never done   MAMMOGRAM  Never done   DEXA SCAN  Never done   PNA vac Low Risk Adult (1 of 2 - PCV13) Never done   INFLUENZA VACCINE  Never done    There are no preventive care reminders to display for this patient.   No results found for: TSH Lab Results  Component Value Date   WBC 8.9 04/26/2018   HGB 13.1 04/26/2018   HCT 38.6 04/26/2018   MCV 90.4 04/26/2018   PLT 298 04/26/2018   Lab Results  Component Value Date   NA 134 (L) 04/26/2018   K 2.8 (L) 04/26/2018   CO2 27 04/26/2018   GLUCOSE 125 (H) 04/26/2018   BUN 18 04/26/2018   CREATININE 1.37 (H) 04/26/2018   BILITOT 0.7 09/28/2012   ALKPHOS 106 09/28/2012   AST 24 09/28/2012   ALT 21 09/28/2012   PROT 7.0 09/28/2012   ALBUMIN 3.8 09/28/2012   CALCIUM 9.0 04/26/2018   ANIONGAP 11 04/26/2018   GFR 48.76 (L) 09/28/2012   Lab Results  Component Value Date   CHOL 203 (H) 09/28/2012   Lab  Results  Component Value Date   HDL 51.80 09/28/2012   [facility] results found for: [facility]  Component Value Date   TRIG 113.0 09/28/2012   Lab Results  Component Value Date   CHOLHDL 4 09/28/2012   No results found for: HGBA1C     Assessment & Plan:   Problem List Items Addressed This Visit   None Visit Diagnoses     Pain of toe of right foot    -  Primary        Meds ordered this encounter  Medications   colchicine 0.6 MG tablet    Sig: Take 2 pills and then one pill an hour later. Then take on pill daily for 4 days.    Dispense:  7 tablet    Refill:  0    Order Specific Question:   Supervising Provider    Answer:   MNoemi Chapel[[id number]   Will treat for gout- no prior hx of gout. With PMH of CKD will avoid NSAID/steroids. Colchicine given for 5 days- advised pt to followup with her doctor in the next few days to ensure she get further evaluation- possible imagine and labs to confirm dx and rule out other acute findings due to hx of surgery on the toe. She states GFR checked by PCP( non on Epic) was 45. No dose adjustment with GFR greater than 30, according to med literature. Advised any worsening, to go to the ER. Take Tyelnol as needed for pain   SWaldon Merl PA-C

## 2021-08-27 ENCOUNTER — Other Ambulatory Visit: Payer: Self-pay | Admitting: Family Medicine

## 2021-08-27 ENCOUNTER — Other Ambulatory Visit: Payer: Self-pay

## 2021-08-27 ENCOUNTER — Ambulatory Visit
Admission: RE | Admit: 2021-08-27 | Discharge: 2021-08-27 | Disposition: A | Discharge status: Home / Self Care | Payer: PPO | Source: Ambulatory Visit | Attending: Family Medicine | Admitting: Family Medicine

## 2021-08-27 DIAGNOSIS — M79671 Pain in right foot: Secondary | ICD-10-CM | POA: Diagnosis not present

## 2021-08-27 DIAGNOSIS — M79676 Pain in unspecified toe(s): Secondary | ICD-10-CM | POA: Diagnosis not present

## 2021-08-27 DIAGNOSIS — Z6832 Body mass index (BMI) 32.0-32.9, adult: Secondary | ICD-10-CM | POA: Diagnosis not present

## 2021-09-02 DIAGNOSIS — E559 Vitamin D deficiency, unspecified: Secondary | ICD-10-CM | POA: Diagnosis not present

## 2021-09-02 DIAGNOSIS — M47816 Spondylosis without myelopathy or radiculopathy, lumbar region: Secondary | ICD-10-CM | POA: Diagnosis not present

## 2021-09-02 DIAGNOSIS — F329 Major depressive disorder, single episode, unspecified: Secondary | ICD-10-CM | POA: Diagnosis not present

## 2021-09-02 DIAGNOSIS — E785 Hyperlipidemia, unspecified: Secondary | ICD-10-CM | POA: Diagnosis not present

## 2021-09-02 DIAGNOSIS — R1013 Epigastric pain: Secondary | ICD-10-CM | POA: Diagnosis not present

## 2021-09-02 DIAGNOSIS — K219 Gastro-esophageal reflux disease without esophagitis: Secondary | ICD-10-CM | POA: Diagnosis not present

## 2021-09-02 DIAGNOSIS — Z6832 Body mass index (BMI) 32.0-32.9, adult: Secondary | ICD-10-CM | POA: Diagnosis not present

## 2021-09-02 DIAGNOSIS — N189 Chronic kidney disease, unspecified: Secondary | ICD-10-CM | POA: Diagnosis not present

## 2021-09-02 DIAGNOSIS — Z0000 Encounter for general adult medical examination without abnormal findings: Secondary | ICD-10-CM | POA: Diagnosis not present

## 2021-09-13 DIAGNOSIS — K219 Gastro-esophageal reflux disease without esophagitis: Secondary | ICD-10-CM | POA: Diagnosis not present

## 2021-09-13 DIAGNOSIS — F329 Major depressive disorder, single episode, unspecified: Secondary | ICD-10-CM | POA: Diagnosis not present

## 2021-09-13 DIAGNOSIS — M47816 Spondylosis without myelopathy or radiculopathy, lumbar region: Secondary | ICD-10-CM | POA: Diagnosis not present

## 2021-09-13 DIAGNOSIS — E785 Hyperlipidemia, unspecified: Secondary | ICD-10-CM | POA: Diagnosis not present

## 2021-09-13 DIAGNOSIS — N189 Chronic kidney disease, unspecified: Secondary | ICD-10-CM | POA: Diagnosis not present

## 2021-09-19 DIAGNOSIS — E559 Vitamin D deficiency, unspecified: Secondary | ICD-10-CM | POA: Diagnosis not present

## 2021-09-19 DIAGNOSIS — R635 Abnormal weight gain: Secondary | ICD-10-CM | POA: Diagnosis not present

## 2021-09-19 DIAGNOSIS — E669 Obesity, unspecified: Secondary | ICD-10-CM | POA: Diagnosis not present

## 2021-09-19 DIAGNOSIS — E7800 Pure hypercholesterolemia, unspecified: Secondary | ICD-10-CM | POA: Diagnosis not present

## 2021-10-31 DIAGNOSIS — M10071 Idiopathic gout, right ankle and foot: Secondary | ICD-10-CM | POA: Diagnosis not present

## 2021-11-05 ENCOUNTER — Ambulatory Visit: Payer: PPO | Admitting: Podiatry

## 2021-11-05 DIAGNOSIS — M778 Other enthesopathies, not elsewhere classified: Secondary | ICD-10-CM

## 2021-11-11 ENCOUNTER — Ambulatory Visit: Payer: PPO | Admitting: Podiatry

## 2021-11-13 DIAGNOSIS — L821 Other seborrheic keratosis: Secondary | ICD-10-CM | POA: Diagnosis not present

## 2021-11-13 DIAGNOSIS — L719 Rosacea, unspecified: Secondary | ICD-10-CM | POA: Diagnosis not present

## 2021-11-13 DIAGNOSIS — R233 Spontaneous ecchymoses: Secondary | ICD-10-CM | POA: Diagnosis not present

## 2021-11-13 DIAGNOSIS — L570 Actinic keratosis: Secondary | ICD-10-CM | POA: Diagnosis not present

## 2021-11-13 DIAGNOSIS — L578 Other skin changes due to chronic exposure to nonionizing radiation: Secondary | ICD-10-CM | POA: Diagnosis not present

## 2022-01-15 DIAGNOSIS — E7800 Pure hypercholesterolemia, unspecified: Secondary | ICD-10-CM | POA: Diagnosis not present

## 2022-01-15 DIAGNOSIS — R7303 Prediabetes: Secondary | ICD-10-CM | POA: Diagnosis not present

## 2022-01-15 DIAGNOSIS — M109 Gout, unspecified: Secondary | ICD-10-CM | POA: Diagnosis not present

## 2022-01-15 DIAGNOSIS — R635 Abnormal weight gain: Secondary | ICD-10-CM | POA: Diagnosis not present

## 2022-01-15 DIAGNOSIS — Z6832 Body mass index (BMI) 32.0-32.9, adult: Secondary | ICD-10-CM | POA: Diagnosis not present

## 2022-01-15 DIAGNOSIS — Z79899 Other long term (current) drug therapy: Secondary | ICD-10-CM | POA: Diagnosis not present

## 2022-01-15 DIAGNOSIS — R0602 Shortness of breath: Secondary | ICD-10-CM | POA: Diagnosis not present

## 2022-01-15 DIAGNOSIS — M79672 Pain in left foot: Secondary | ICD-10-CM | POA: Diagnosis not present

## 2022-01-20 ENCOUNTER — Ambulatory Visit: Payer: PPO | Admitting: Podiatry

## 2022-01-29 DIAGNOSIS — H52223 Regular astigmatism, bilateral: Secondary | ICD-10-CM | POA: Diagnosis not present

## 2022-01-29 DIAGNOSIS — H524 Presbyopia: Secondary | ICD-10-CM | POA: Diagnosis not present

## 2022-01-29 DIAGNOSIS — H5203 Hypermetropia, bilateral: Secondary | ICD-10-CM | POA: Diagnosis not present

## 2022-01-29 DIAGNOSIS — H04123 Dry eye syndrome of bilateral lacrimal glands: Secondary | ICD-10-CM | POA: Diagnosis not present

## 2022-01-29 DIAGNOSIS — H11002 Unspecified pterygium of left eye: Secondary | ICD-10-CM | POA: Diagnosis not present

## 2022-01-29 DIAGNOSIS — H353132 Nonexudative age-related macular degeneration, bilateral, intermediate dry stage: Secondary | ICD-10-CM | POA: Diagnosis not present

## 2022-01-29 DIAGNOSIS — H2513 Age-related nuclear cataract, bilateral: Secondary | ICD-10-CM | POA: Diagnosis not present

## 2022-02-12 DIAGNOSIS — Z6832 Body mass index (BMI) 32.0-32.9, adult: Secondary | ICD-10-CM | POA: Diagnosis not present

## 2022-02-12 DIAGNOSIS — R635 Abnormal weight gain: Secondary | ICD-10-CM | POA: Diagnosis not present

## 2022-02-12 DIAGNOSIS — M109 Gout, unspecified: Secondary | ICD-10-CM | POA: Diagnosis not present

## 2022-02-12 DIAGNOSIS — E7800 Pure hypercholesterolemia, unspecified: Secondary | ICD-10-CM | POA: Diagnosis not present

## 2022-02-12 DIAGNOSIS — R7303 Prediabetes: Secondary | ICD-10-CM | POA: Diagnosis not present

## 2022-03-04 DIAGNOSIS — D849 Immunodeficiency, unspecified: Secondary | ICD-10-CM | POA: Diagnosis not present

## 2022-03-04 DIAGNOSIS — Z7689 Persons encountering health services in other specified circumstances: Secondary | ICD-10-CM | POA: Diagnosis not present

## 2022-03-04 DIAGNOSIS — R140 Abdominal distension (gaseous): Secondary | ICD-10-CM | POA: Diagnosis not present

## 2022-03-04 DIAGNOSIS — K219 Gastro-esophageal reflux disease without esophagitis: Secondary | ICD-10-CM | POA: Diagnosis not present

## 2022-03-04 DIAGNOSIS — R0602 Shortness of breath: Secondary | ICD-10-CM | POA: Diagnosis not present

## 2022-03-04 DIAGNOSIS — J3089 Other allergic rhinitis: Secondary | ICD-10-CM | POA: Diagnosis not present

## 2022-03-04 DIAGNOSIS — J302 Other seasonal allergic rhinitis: Secondary | ICD-10-CM | POA: Diagnosis not present

## 2022-03-05 DIAGNOSIS — H353132 Nonexudative age-related macular degeneration, bilateral, intermediate dry stage: Secondary | ICD-10-CM | POA: Diagnosis not present

## 2022-03-05 DIAGNOSIS — H2513 Age-related nuclear cataract, bilateral: Secondary | ICD-10-CM | POA: Diagnosis not present

## 2022-03-12 DIAGNOSIS — M79642 Pain in left hand: Secondary | ICD-10-CM | POA: Diagnosis not present

## 2022-03-12 DIAGNOSIS — R635 Abnormal weight gain: Secondary | ICD-10-CM | POA: Diagnosis not present

## 2022-03-25 DIAGNOSIS — J3089 Other allergic rhinitis: Secondary | ICD-10-CM | POA: Diagnosis not present

## 2022-03-25 DIAGNOSIS — R0602 Shortness of breath: Secondary | ICD-10-CM | POA: Diagnosis not present

## 2022-03-25 DIAGNOSIS — J302 Other seasonal allergic rhinitis: Secondary | ICD-10-CM | POA: Diagnosis not present

## 2022-03-25 DIAGNOSIS — K219 Gastro-esophageal reflux disease without esophagitis: Secondary | ICD-10-CM | POA: Diagnosis not present

## 2022-04-09 DIAGNOSIS — R0981 Nasal congestion: Secondary | ICD-10-CM | POA: Diagnosis not present

## 2022-04-09 DIAGNOSIS — R053 Chronic cough: Secondary | ICD-10-CM | POA: Diagnosis not present

## 2022-04-09 DIAGNOSIS — J22 Unspecified acute lower respiratory infection: Secondary | ICD-10-CM | POA: Diagnosis not present

## 2022-04-17 DIAGNOSIS — I10 Essential (primary) hypertension: Secondary | ICD-10-CM | POA: Diagnosis not present

## 2022-04-17 DIAGNOSIS — N183 Chronic kidney disease, stage 3 unspecified: Secondary | ICD-10-CM | POA: Diagnosis not present

## 2022-04-17 DIAGNOSIS — D649 Anemia, unspecified: Secondary | ICD-10-CM | POA: Diagnosis not present

## 2022-04-17 DIAGNOSIS — R809 Proteinuria, unspecified: Secondary | ICD-10-CM | POA: Diagnosis not present

## 2022-04-17 DIAGNOSIS — N189 Chronic kidney disease, unspecified: Secondary | ICD-10-CM | POA: Diagnosis not present

## 2022-04-17 DIAGNOSIS — E669 Obesity, unspecified: Secondary | ICD-10-CM | POA: Diagnosis not present

## 2022-04-17 DIAGNOSIS — R309 Painful micturition, unspecified: Secondary | ICD-10-CM | POA: Diagnosis not present

## 2022-04-17 DIAGNOSIS — E211 Secondary hyperparathyroidism, not elsewhere classified: Secondary | ICD-10-CM | POA: Diagnosis not present

## 2022-04-17 DIAGNOSIS — E785 Hyperlipidemia, unspecified: Secondary | ICD-10-CM | POA: Diagnosis not present

## 2022-04-17 DIAGNOSIS — E559 Vitamin D deficiency, unspecified: Secondary | ICD-10-CM | POA: Diagnosis not present

## 2022-04-22 DIAGNOSIS — Z23 Encounter for immunization: Secondary | ICD-10-CM | POA: Diagnosis not present

## 2022-04-22 DIAGNOSIS — J302 Other seasonal allergic rhinitis: Secondary | ICD-10-CM | POA: Diagnosis not present

## 2022-04-22 DIAGNOSIS — D803 Selective deficiency of immunoglobulin G [IgG] subclasses: Secondary | ICD-10-CM | POA: Diagnosis not present

## 2022-04-22 DIAGNOSIS — J3089 Other allergic rhinitis: Secondary | ICD-10-CM | POA: Diagnosis not present

## 2022-04-22 DIAGNOSIS — R0602 Shortness of breath: Secondary | ICD-10-CM | POA: Diagnosis not present

## 2022-05-01 DIAGNOSIS — R635 Abnormal weight gain: Secondary | ICD-10-CM | POA: Diagnosis not present

## 2022-05-13 DIAGNOSIS — T1512XA Foreign body in conjunctival sac, left eye, initial encounter: Secondary | ICD-10-CM | POA: Diagnosis not present

## 2022-06-02 DIAGNOSIS — R635 Abnormal weight gain: Secondary | ICD-10-CM | POA: Diagnosis not present

## 2022-07-02 DIAGNOSIS — R7303 Prediabetes: Secondary | ICD-10-CM | POA: Diagnosis not present

## 2022-07-02 DIAGNOSIS — E669 Obesity, unspecified: Secondary | ICD-10-CM | POA: Diagnosis not present

## 2022-07-02 DIAGNOSIS — E7800 Pure hypercholesterolemia, unspecified: Secondary | ICD-10-CM | POA: Diagnosis not present

## 2022-07-02 DIAGNOSIS — E559 Vitamin D deficiency, unspecified: Secondary | ICD-10-CM | POA: Diagnosis not present

## 2022-07-02 DIAGNOSIS — N1832 Chronic kidney disease, stage 3b: Secondary | ICD-10-CM | POA: Diagnosis not present

## 2022-07-02 DIAGNOSIS — R635 Abnormal weight gain: Secondary | ICD-10-CM | POA: Diagnosis not present

## 2022-07-02 DIAGNOSIS — Z6831 Body mass index (BMI) 31.0-31.9, adult: Secondary | ICD-10-CM | POA: Diagnosis not present

## 2022-07-07 DIAGNOSIS — M180 Bilateral primary osteoarthritis of first carpometacarpal joints: Secondary | ICD-10-CM | POA: Diagnosis not present

## 2022-07-07 DIAGNOSIS — M79642 Pain in left hand: Secondary | ICD-10-CM | POA: Diagnosis not present

## 2022-08-11 DIAGNOSIS — E559 Vitamin D deficiency, unspecified: Secondary | ICD-10-CM | POA: Diagnosis not present

## 2022-08-11 DIAGNOSIS — N183 Chronic kidney disease, stage 3 unspecified: Secondary | ICD-10-CM | POA: Diagnosis not present

## 2022-08-11 DIAGNOSIS — N189 Chronic kidney disease, unspecified: Secondary | ICD-10-CM | POA: Diagnosis not present

## 2022-08-11 DIAGNOSIS — R309 Painful micturition, unspecified: Secondary | ICD-10-CM | POA: Diagnosis not present

## 2022-08-11 DIAGNOSIS — I10 Essential (primary) hypertension: Secondary | ICD-10-CM | POA: Diagnosis not present

## 2022-08-11 DIAGNOSIS — E669 Obesity, unspecified: Secondary | ICD-10-CM | POA: Diagnosis not present

## 2022-08-11 DIAGNOSIS — E785 Hyperlipidemia, unspecified: Secondary | ICD-10-CM | POA: Diagnosis not present

## 2022-08-11 DIAGNOSIS — R809 Proteinuria, unspecified: Secondary | ICD-10-CM | POA: Diagnosis not present

## 2022-08-11 DIAGNOSIS — D649 Anemia, unspecified: Secondary | ICD-10-CM | POA: Diagnosis not present

## 2022-08-18 DIAGNOSIS — M19032 Primary osteoarthritis, left wrist: Secondary | ICD-10-CM | POA: Diagnosis not present

## 2022-08-18 DIAGNOSIS — M1812 Unilateral primary osteoarthritis of first carpometacarpal joint, left hand: Secondary | ICD-10-CM | POA: Diagnosis not present

## 2022-09-03 DIAGNOSIS — Z1389 Encounter for screening for other disorder: Secondary | ICD-10-CM | POA: Diagnosis not present

## 2022-09-03 DIAGNOSIS — Z23 Encounter for immunization: Secondary | ICD-10-CM | POA: Diagnosis not present

## 2022-09-03 DIAGNOSIS — Z0000 Encounter for general adult medical examination without abnormal findings: Secondary | ICD-10-CM | POA: Diagnosis not present

## 2022-09-10 DIAGNOSIS — M1812 Unilateral primary osteoarthritis of first carpometacarpal joint, left hand: Secondary | ICD-10-CM | POA: Diagnosis not present

## 2022-09-10 DIAGNOSIS — M654 Radial styloid tenosynovitis [de Quervain]: Secondary | ICD-10-CM | POA: Diagnosis not present

## 2022-09-10 DIAGNOSIS — G8918 Other acute postprocedural pain: Secondary | ICD-10-CM | POA: Diagnosis not present

## 2022-09-15 DIAGNOSIS — M25642 Stiffness of left hand, not elsewhere classified: Secondary | ICD-10-CM | POA: Diagnosis not present

## 2022-09-15 DIAGNOSIS — M79642 Pain in left hand: Secondary | ICD-10-CM | POA: Diagnosis not present

## 2022-09-16 DIAGNOSIS — Z6830 Body mass index (BMI) 30.0-30.9, adult: Secondary | ICD-10-CM | POA: Diagnosis not present

## 2022-09-16 DIAGNOSIS — G894 Chronic pain syndrome: Secondary | ICD-10-CM | POA: Diagnosis not present

## 2022-09-16 DIAGNOSIS — E559 Vitamin D deficiency, unspecified: Secondary | ICD-10-CM | POA: Diagnosis not present

## 2022-09-16 DIAGNOSIS — R748 Abnormal levels of other serum enzymes: Secondary | ICD-10-CM | POA: Diagnosis not present

## 2022-09-16 DIAGNOSIS — E785 Hyperlipidemia, unspecified: Secondary | ICD-10-CM | POA: Diagnosis not present

## 2022-09-16 DIAGNOSIS — I10 Essential (primary) hypertension: Secondary | ICD-10-CM | POA: Diagnosis not present

## 2022-09-27 IMAGING — MR MR CERVICAL SPINE W/O CM
5 series · 34 of 48 positions shown · non-contrast
Comparison: Radiographs of the cervical spine 07/04/2021.

CLINICAL DATA: Right upper extremity numbness 3RI.I ([id number]).
Additional history provided by scanning technologist: Patient
reports right arm/hand weakness and right arm/finger numbness and
tingling for 1 month. History of cervical spine and lumbar spine
surgery.

EXAM:
MRI CERVICAL SPINE WITHOUT CONTRAST
TECHNIQUE: Multiplanar, multisequence MR imaging of the cervical spine was
performed. No intravenous contrast was administered.

[Series 2: T2 · sagittal · 3.0mm · 0.41mm/px · 7 of 17 slices shown (1 of 2)]
[im 1/17]
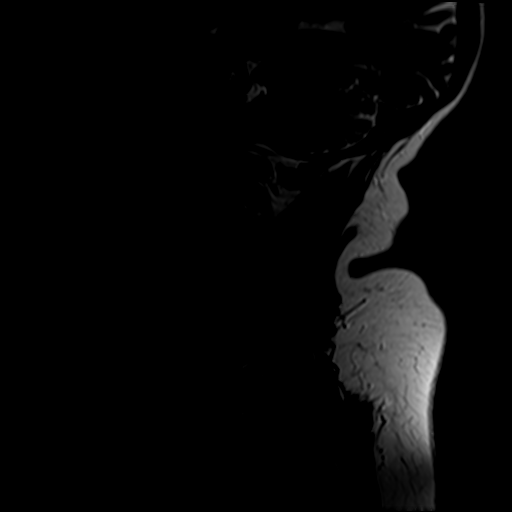
[im 3/17]
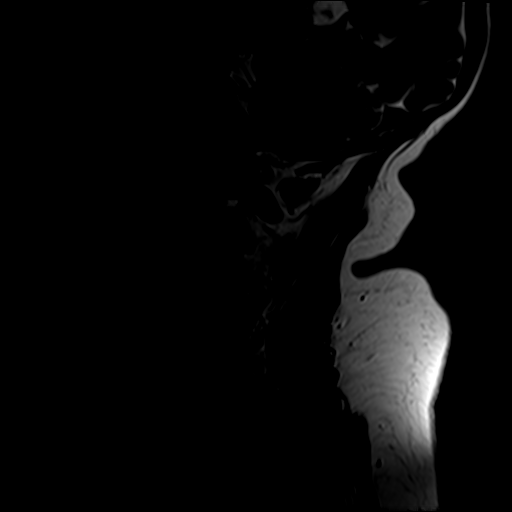
[im 6/17]
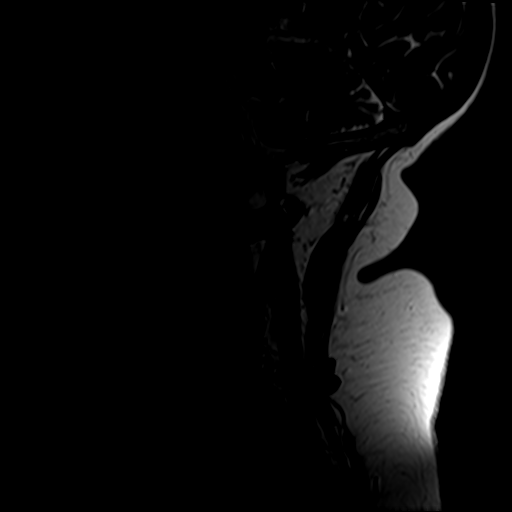
[im 9/17]
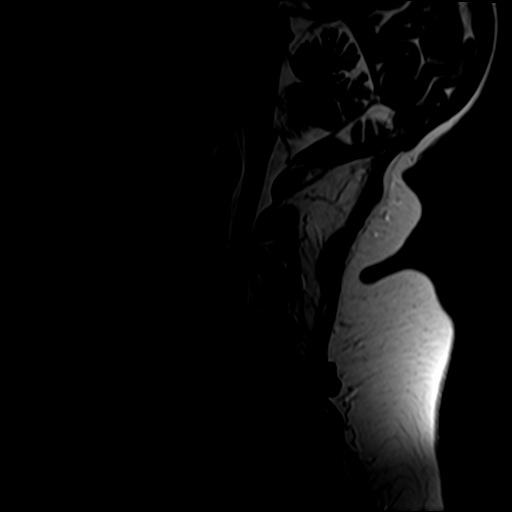
[im 11/17]
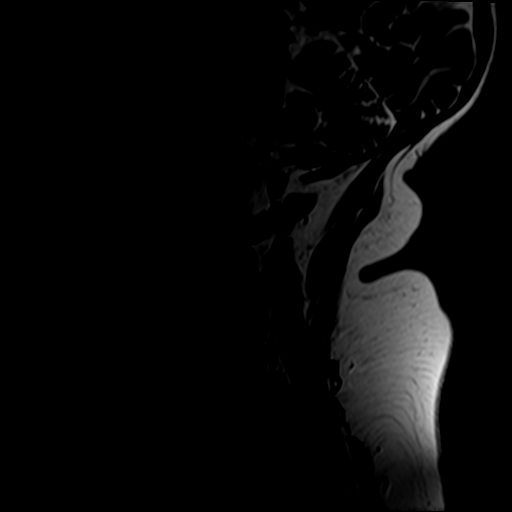
[im 14/17]
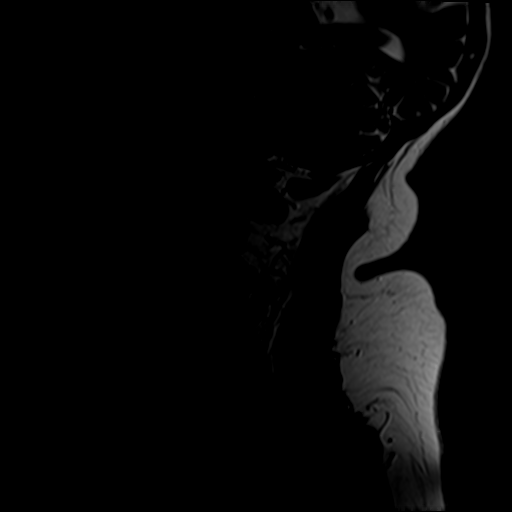
[im 17/17]
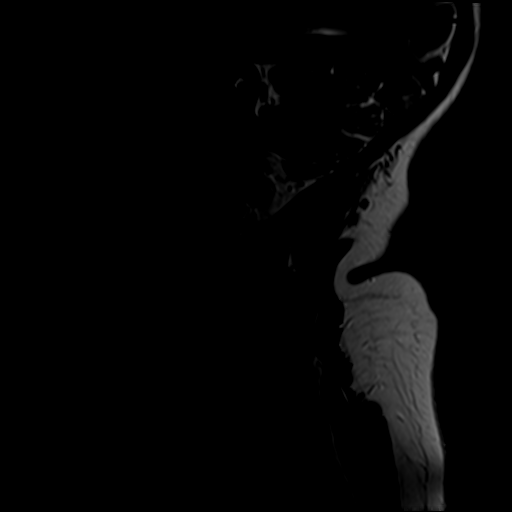

[Series 3: STIR · sagittal · 3.0mm · 0.82mm/px · 7 of 17 slices shown]
[im 1/17]
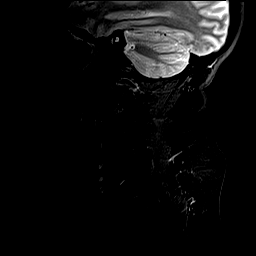
[im 3/17]
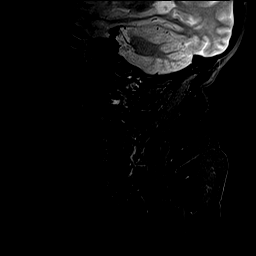
[im 6/17]
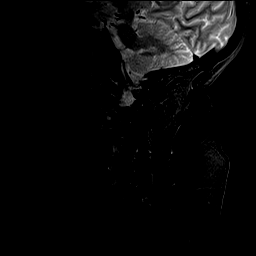
[im 9/17]
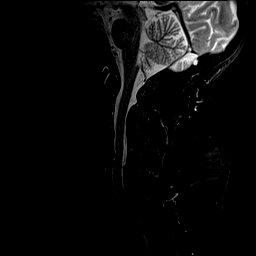
[im 11/17]
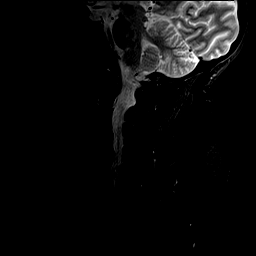
[im 14/17]
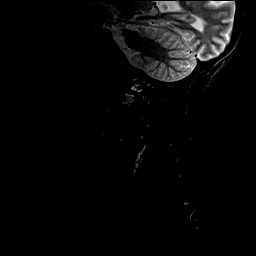
[im 17/17]
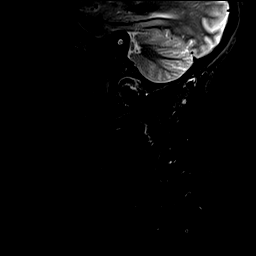

[Series 4: T1 · sagittal · 3.0mm · 0.82mm/px · 8 of 17 slices shown]
[im 1/17]
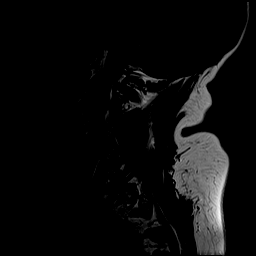
[im 3/17]
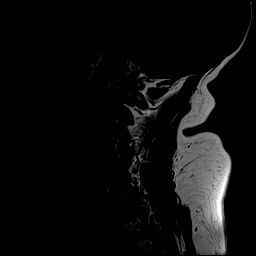
[im 5/17]
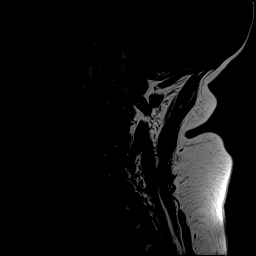
[im 7/17]
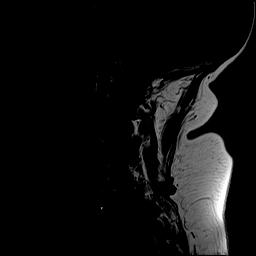
[im 10/17]
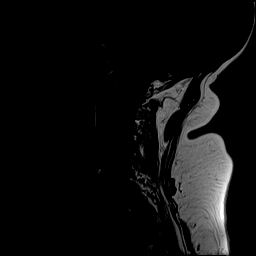
[im 12/17]
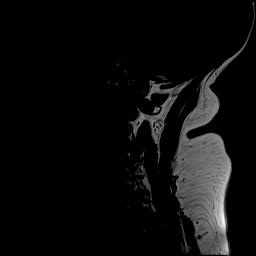
[im 14/17]
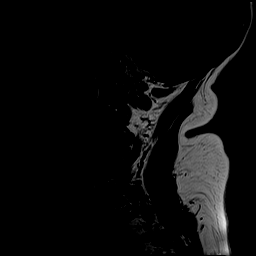
[im 17/17]
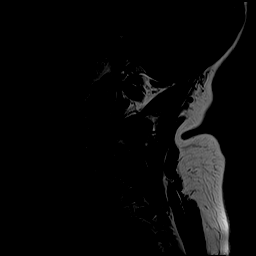

[Series 5: T2 · axial · 3.0mm · 0.70mm/px · z∈[-85,+13]mm · 9 of 28 slices shown (2 of 2)]
[im 1/28]
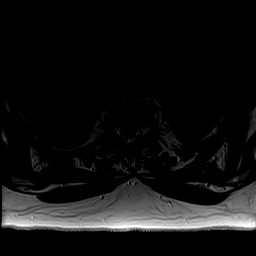
[im 5/28]
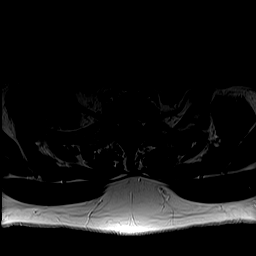
[im 10/28]
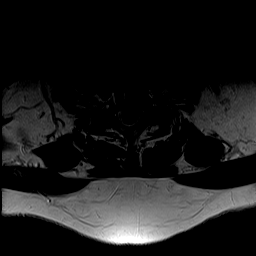
[im 12/28]
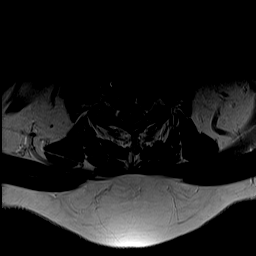
[im 14/28]
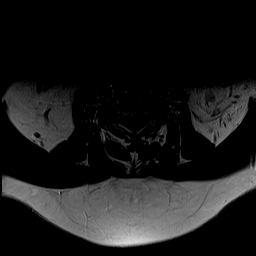
[im 16/28]
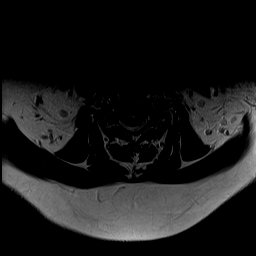
[im 19/28]
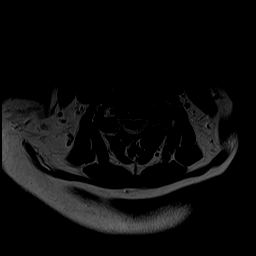
[im 23/28]
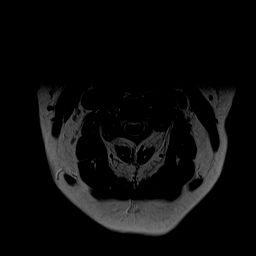
[im 28/28]
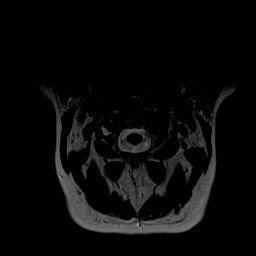

[Series 6: GRE · axial · 3.0mm · 0.35mm/px · z∈[-85,-52]mm · 3 of 28 slices shown]
[im 1/28]
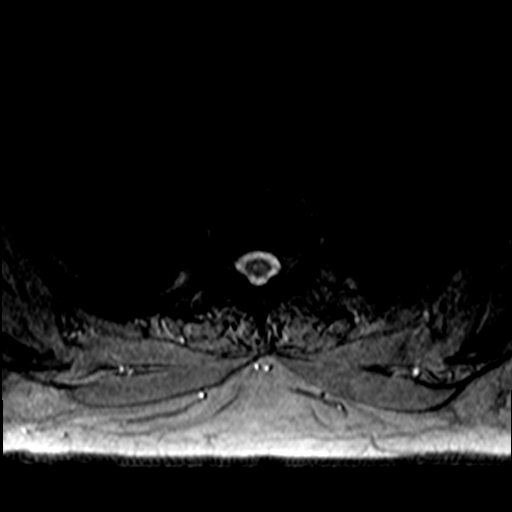
[im 5/28]
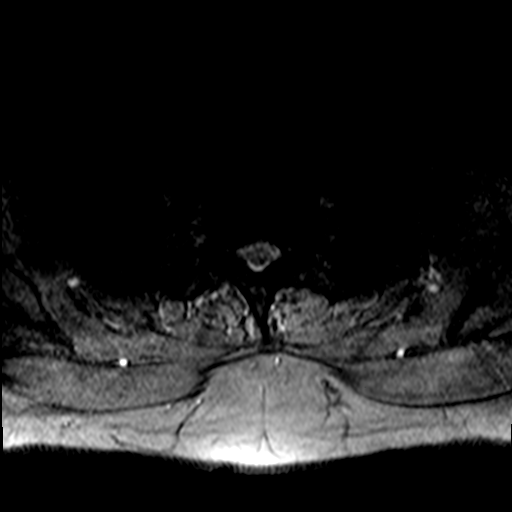
[im 10/28]
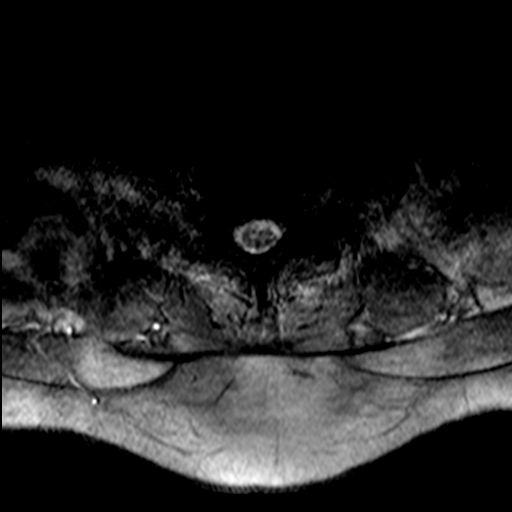

[34 of 48 positions shown; findings below may reference images not displayed]

CT of the
cervical spine 12/17/2006. Report from cervical spine MRI 01/05/2001
(images unavailable).
FINDINGS: Alignment: Trace C3-C4 grade 1 anterolisthesis. Trace C4-C5 grade 1
retrolisthesis. 2 mm C7-T1 grade 1 anterolisthesis. Trace T1-T2 and
T2-T3 grade 1 anterolisthesis.

Vertebrae: Susceptibility artifact arising from ACDF hardware at the
C5-C7 levels. No appreciable significant marrow edema or focal
suspicious osseous lesion.

Cord: No spinal cord signal abnormality is identified.

Posterior Fossa, vertebral arteries, paraspinal tissues:
Incidentally noted partially empty sella turcica. Flow voids
preserved within the imaged cervical vertebral arteries. Paraspinal
soft tissues within normal limits.

Disc levels:

Multilevel disc degeneration at the non operative levels. Most
notably, there is moderate disc degeneration at C4-C5 and C7-T1,
moderate to moderately advanced disc degeneration at T1-T2 and
moderate disc degeneration at T2-T3.

Trace atlantooccipital and atlantoaxial joint effusions.

C2-C3: Tiny central disc protrusion. Uncovertebral hypertrophy and
facet arthrosis on the right. No significant spinal canal stenosis.
Mild right neural foraminal narrowing.

C3-C4: Trace grade 1 anterolisthesis. Slight disc uncovering.
Shallow central disc protrusion. Facet arthrosis (greater on the
left). No significant spinal canal stenosis. Bilateral neural
foraminal narrowing (mild/moderate right, moderate left).

C4-C5: Trace grade 1 retrolisthesis. Disc bulge with superimposed
small central disc protrusion. Bilateral uncovertebral hypertrophy.
Mild facet arthrosis and ligamentum flavum hypertrophy on the left.
Mild spinal canal stenosis. The disc protrusion contacts and
minimally flattens the ventral spinal cord (series 2, image 9).
Bilateral neural foraminal narrowing (mild right, moderate left).

C5-C6: Prior ACDF. Vertebral osteophytosis. Minimal facet
hypertrophy. No significant spinal canal stenosis. Mild left neural
foraminal narrowing.

C6-C7: Prior ACDF. No significant spinal canal or foraminal
stenosis.

C7-T1: 2 mm grade 1 anterolisthesis. Disc uncovering. Facet
arthrosis (greater on the left). Ligamentum flavum hypertrophy. Mild
relative spinal canal narrowing without spinal cord mass effect.
Mild left neural foraminal narrowing.

T1-T2: Disc bulge with superimposed small central disc protrusion.
Endplate spurring. Ligamentum flavum hypertrophy. Mild relative
spinal canal narrowing without spinal cord mass effect. Mild
bilateral foraminal stenosis.
IMPRESSION: Prior C5-C7 ACDF. No significant spinal canal stenosis at these
levels. Mild left C5-C6 foraminal stenosis.

Additional cervical and upper thoracic spondylosis, as outlined.
Levels of mild relative spinal canal stenosis. Most notably at
C4-C5, a small central disc protrusion contributes to mild spinal
canal stenosis, contacting and minimally flattening the ventral
spinal cord.

Multilevel neural foraminal narrowing, greatest bilaterally at C3-C4
(mild/moderate right, moderate left) and on the left at C4-C5
(moderate). Additional sites of mild foraminal stenosis, as
detailed.

Mild multilevel grade 1 spondylolisthesis.

Trace atlanto-axial and atlantooccipital joint effusions.

## 2022-10-16 DIAGNOSIS — M79641 Pain in right hand: Secondary | ICD-10-CM | POA: Diagnosis not present

## 2022-10-23 DIAGNOSIS — M79642 Pain in left hand: Secondary | ICD-10-CM | POA: Diagnosis not present

## 2022-10-23 DIAGNOSIS — M25632 Stiffness of left wrist, not elsewhere classified: Secondary | ICD-10-CM | POA: Diagnosis not present

## 2022-10-23 DIAGNOSIS — M25642 Stiffness of left hand, not elsewhere classified: Secondary | ICD-10-CM | POA: Diagnosis not present

## 2022-10-28 DIAGNOSIS — R7309 Other abnormal glucose: Secondary | ICD-10-CM | POA: Diagnosis not present

## 2022-10-28 DIAGNOSIS — Z760 Encounter for issue of repeat prescription: Secondary | ICD-10-CM | POA: Diagnosis not present

## 2022-10-28 DIAGNOSIS — Z6831 Body mass index (BMI) 31.0-31.9, adult: Secondary | ICD-10-CM | POA: Diagnosis not present

## 2022-10-28 DIAGNOSIS — R072 Precordial pain: Secondary | ICD-10-CM | POA: Diagnosis not present

## 2022-10-30 DIAGNOSIS — M25632 Stiffness of left wrist, not elsewhere classified: Secondary | ICD-10-CM | POA: Diagnosis not present

## 2022-10-30 DIAGNOSIS — M25642 Stiffness of left hand, not elsewhere classified: Secondary | ICD-10-CM | POA: Diagnosis not present

## 2022-10-30 DIAGNOSIS — M79642 Pain in left hand: Secondary | ICD-10-CM | POA: Diagnosis not present

## 2022-11-01 DIAGNOSIS — E559 Vitamin D deficiency, unspecified: Secondary | ICD-10-CM | POA: Diagnosis not present

## 2022-11-01 DIAGNOSIS — R7303 Prediabetes: Secondary | ICD-10-CM | POA: Diagnosis not present

## 2022-11-01 DIAGNOSIS — E669 Obesity, unspecified: Secondary | ICD-10-CM | POA: Diagnosis not present

## 2022-11-01 DIAGNOSIS — N1832 Chronic kidney disease, stage 3b: Secondary | ICD-10-CM | POA: Diagnosis not present

## 2022-11-01 DIAGNOSIS — E7800 Pure hypercholesterolemia, unspecified: Secondary | ICD-10-CM | POA: Diagnosis not present

## 2022-11-01 DIAGNOSIS — R7309 Other abnormal glucose: Secondary | ICD-10-CM | POA: Diagnosis not present

## 2022-11-01 DIAGNOSIS — R030 Elevated blood-pressure reading, without diagnosis of hypertension: Secondary | ICD-10-CM | POA: Diagnosis not present

## 2022-11-01 DIAGNOSIS — R635 Abnormal weight gain: Secondary | ICD-10-CM | POA: Diagnosis not present

## 2022-11-01 DIAGNOSIS — Z6831 Body mass index (BMI) 31.0-31.9, adult: Secondary | ICD-10-CM | POA: Diagnosis not present

## 2022-11-06 DIAGNOSIS — M79642 Pain in left hand: Secondary | ICD-10-CM | POA: Diagnosis not present

## 2022-11-06 DIAGNOSIS — M25642 Stiffness of left hand, not elsewhere classified: Secondary | ICD-10-CM | POA: Diagnosis not present

## 2022-11-06 DIAGNOSIS — M25632 Stiffness of left wrist, not elsewhere classified: Secondary | ICD-10-CM | POA: Diagnosis not present

## 2022-11-18 DIAGNOSIS — M79642 Pain in left hand: Secondary | ICD-10-CM | POA: Diagnosis not present

## 2022-11-18 DIAGNOSIS — M19049 Primary osteoarthritis, unspecified hand: Secondary | ICD-10-CM | POA: Diagnosis not present

## 2022-11-18 DIAGNOSIS — M25642 Stiffness of left hand, not elsewhere classified: Secondary | ICD-10-CM | POA: Diagnosis not present

## 2022-11-18 DIAGNOSIS — M25632 Stiffness of left wrist, not elsewhere classified: Secondary | ICD-10-CM | POA: Diagnosis not present

## 2022-12-08 DIAGNOSIS — E559 Vitamin D deficiency, unspecified: Secondary | ICD-10-CM | POA: Diagnosis not present

## 2022-12-08 DIAGNOSIS — N1832 Chronic kidney disease, stage 3b: Secondary | ICD-10-CM | POA: Diagnosis not present

## 2022-12-08 DIAGNOSIS — E7800 Pure hypercholesterolemia, unspecified: Secondary | ICD-10-CM | POA: Diagnosis not present

## 2022-12-08 DIAGNOSIS — R030 Elevated blood-pressure reading, without diagnosis of hypertension: Secondary | ICD-10-CM | POA: Diagnosis not present

## 2022-12-08 DIAGNOSIS — R7303 Prediabetes: Secondary | ICD-10-CM | POA: Diagnosis not present

## 2022-12-08 DIAGNOSIS — E669 Obesity, unspecified: Secondary | ICD-10-CM | POA: Diagnosis not present

## 2022-12-08 DIAGNOSIS — R635 Abnormal weight gain: Secondary | ICD-10-CM | POA: Diagnosis not present

## 2022-12-08 DIAGNOSIS — Z6831 Body mass index (BMI) 31.0-31.9, adult: Secondary | ICD-10-CM | POA: Diagnosis not present

## 2022-12-08 DIAGNOSIS — R7309 Other abnormal glucose: Secondary | ICD-10-CM | POA: Diagnosis not present

## 2022-12-18 DIAGNOSIS — M79642 Pain in left hand: Secondary | ICD-10-CM | POA: Diagnosis not present

## 2022-12-18 DIAGNOSIS — M19049 Primary osteoarthritis, unspecified hand: Secondary | ICD-10-CM | POA: Diagnosis not present

## 2022-12-24 DIAGNOSIS — K219 Gastro-esophageal reflux disease without esophagitis: Secondary | ICD-10-CM | POA: Diagnosis not present

## 2022-12-24 DIAGNOSIS — R7303 Prediabetes: Secondary | ICD-10-CM | POA: Diagnosis not present

## 2022-12-24 DIAGNOSIS — N1832 Chronic kidney disease, stage 3b: Secondary | ICD-10-CM | POA: Diagnosis not present

## 2022-12-24 DIAGNOSIS — I10 Essential (primary) hypertension: Secondary | ICD-10-CM | POA: Diagnosis not present

## 2022-12-24 DIAGNOSIS — E785 Hyperlipidemia, unspecified: Secondary | ICD-10-CM | POA: Diagnosis not present

## 2022-12-24 DIAGNOSIS — G894 Chronic pain syndrome: Secondary | ICD-10-CM | POA: Diagnosis not present

## 2023-01-27 DIAGNOSIS — M79642 Pain in left hand: Secondary | ICD-10-CM | POA: Diagnosis not present

## 2023-02-09 DIAGNOSIS — H2513 Age-related nuclear cataract, bilateral: Secondary | ICD-10-CM | POA: Diagnosis not present

## 2023-02-09 DIAGNOSIS — H52223 Regular astigmatism, bilateral: Secondary | ICD-10-CM | POA: Diagnosis not present

## 2023-02-09 DIAGNOSIS — H11002 Unspecified pterygium of left eye: Secondary | ICD-10-CM | POA: Diagnosis not present

## 2023-02-09 DIAGNOSIS — H353132 Nonexudative age-related macular degeneration, bilateral, intermediate dry stage: Secondary | ICD-10-CM | POA: Diagnosis not present

## 2023-02-09 DIAGNOSIS — H524 Presbyopia: Secondary | ICD-10-CM | POA: Diagnosis not present

## 2023-02-09 DIAGNOSIS — H04123 Dry eye syndrome of bilateral lacrimal glands: Secondary | ICD-10-CM | POA: Diagnosis not present

## 2023-02-09 DIAGNOSIS — H5203 Hypermetropia, bilateral: Secondary | ICD-10-CM | POA: Diagnosis not present

## 2023-02-18 DIAGNOSIS — I10 Essential (primary) hypertension: Secondary | ICD-10-CM | POA: Diagnosis not present

## 2023-02-18 DIAGNOSIS — R309 Painful micturition, unspecified: Secondary | ICD-10-CM | POA: Diagnosis not present

## 2023-02-18 DIAGNOSIS — M109 Gout, unspecified: Secondary | ICD-10-CM | POA: Diagnosis not present

## 2023-02-18 DIAGNOSIS — E669 Obesity, unspecified: Secondary | ICD-10-CM | POA: Diagnosis not present

## 2023-02-18 DIAGNOSIS — D6489 Other specified anemias: Secondary | ICD-10-CM | POA: Diagnosis not present

## 2023-02-18 DIAGNOSIS — E211 Secondary hyperparathyroidism, not elsewhere classified: Secondary | ICD-10-CM | POA: Diagnosis not present

## 2023-02-18 DIAGNOSIS — N183 Chronic kidney disease, stage 3 unspecified: Secondary | ICD-10-CM | POA: Diagnosis not present

## 2023-02-18 DIAGNOSIS — E559 Vitamin D deficiency, unspecified: Secondary | ICD-10-CM | POA: Diagnosis not present

## 2023-02-18 DIAGNOSIS — N189 Chronic kidney disease, unspecified: Secondary | ICD-10-CM | POA: Diagnosis not present

## 2023-02-18 DIAGNOSIS — R809 Proteinuria, unspecified: Secondary | ICD-10-CM | POA: Diagnosis not present

## 2023-03-04 ENCOUNTER — Other Ambulatory Visit: Payer: Self-pay

## 2023-03-04 DIAGNOSIS — M25532 Pain in left wrist: Secondary | ICD-10-CM | POA: Diagnosis not present

## 2023-03-04 DIAGNOSIS — M25732 Osteophyte, left wrist: Secondary | ICD-10-CM | POA: Diagnosis not present

## 2023-03-09 DIAGNOSIS — M461 Sacroiliitis, not elsewhere classified: Secondary | ICD-10-CM | POA: Diagnosis not present

## 2023-03-13 DIAGNOSIS — M19042 Primary osteoarthritis, left hand: Secondary | ICD-10-CM | POA: Diagnosis not present

## 2023-03-17 DIAGNOSIS — M461 Sacroiliitis, not elsewhere classified: Secondary | ICD-10-CM | POA: Diagnosis not present

## 2023-03-25 DIAGNOSIS — J069 Acute upper respiratory infection, unspecified: Secondary | ICD-10-CM | POA: Diagnosis not present

## 2023-03-25 DIAGNOSIS — R059 Cough, unspecified: Secondary | ICD-10-CM | POA: Diagnosis not present

## 2023-03-25 DIAGNOSIS — Z6833 Body mass index (BMI) 33.0-33.9, adult: Secondary | ICD-10-CM | POA: Diagnosis not present

## 2023-04-04 DIAGNOSIS — R635 Abnormal weight gain: Secondary | ICD-10-CM | POA: Diagnosis not present

## 2023-04-04 DIAGNOSIS — R030 Elevated blood-pressure reading, without diagnosis of hypertension: Secondary | ICD-10-CM | POA: Diagnosis not present

## 2023-04-04 DIAGNOSIS — R7309 Other abnormal glucose: Secondary | ICD-10-CM | POA: Diagnosis not present

## 2023-04-04 DIAGNOSIS — R7303 Prediabetes: Secondary | ICD-10-CM | POA: Diagnosis not present

## 2023-04-04 DIAGNOSIS — E559 Vitamin D deficiency, unspecified: Secondary | ICD-10-CM | POA: Diagnosis not present

## 2023-04-04 DIAGNOSIS — E669 Obesity, unspecified: Secondary | ICD-10-CM | POA: Diagnosis not present

## 2023-04-04 DIAGNOSIS — Z6831 Body mass index (BMI) 31.0-31.9, adult: Secondary | ICD-10-CM | POA: Diagnosis not present

## 2023-04-04 DIAGNOSIS — N1832 Chronic kidney disease, stage 3b: Secondary | ICD-10-CM | POA: Diagnosis not present

## 2023-04-04 DIAGNOSIS — E7800 Pure hypercholesterolemia, unspecified: Secondary | ICD-10-CM | POA: Diagnosis not present

## 2023-04-09 DIAGNOSIS — M19042 Primary osteoarthritis, left hand: Secondary | ICD-10-CM | POA: Diagnosis not present

## 2023-04-28 DIAGNOSIS — K219 Gastro-esophageal reflux disease without esophagitis: Secondary | ICD-10-CM | POA: Diagnosis not present

## 2023-04-28 DIAGNOSIS — I10 Essential (primary) hypertension: Secondary | ICD-10-CM | POA: Diagnosis not present

## 2023-04-28 DIAGNOSIS — M542 Cervicalgia: Secondary | ICD-10-CM | POA: Diagnosis not present

## 2023-04-28 DIAGNOSIS — N1832 Chronic kidney disease, stage 3b: Secondary | ICD-10-CM | POA: Diagnosis not present

## 2023-04-28 DIAGNOSIS — E785 Hyperlipidemia, unspecified: Secondary | ICD-10-CM | POA: Diagnosis not present

## 2023-05-13 DIAGNOSIS — M19042 Primary osteoarthritis, left hand: Secondary | ICD-10-CM | POA: Diagnosis not present

## 2023-05-13 DIAGNOSIS — M19041 Primary osteoarthritis, right hand: Secondary | ICD-10-CM | POA: Diagnosis not present

## 2023-05-14 DIAGNOSIS — E7800 Pure hypercholesterolemia, unspecified: Secondary | ICD-10-CM | POA: Diagnosis not present

## 2023-05-14 DIAGNOSIS — Z6831 Body mass index (BMI) 31.0-31.9, adult: Secondary | ICD-10-CM | POA: Diagnosis not present

## 2023-05-14 DIAGNOSIS — E559 Vitamin D deficiency, unspecified: Secondary | ICD-10-CM | POA: Diagnosis not present

## 2023-05-14 DIAGNOSIS — N1832 Chronic kidney disease, stage 3b: Secondary | ICD-10-CM | POA: Diagnosis not present

## 2023-05-14 DIAGNOSIS — R7309 Other abnormal glucose: Secondary | ICD-10-CM | POA: Diagnosis not present

## 2023-05-14 DIAGNOSIS — Z79899 Other long term (current) drug therapy: Secondary | ICD-10-CM | POA: Diagnosis not present

## 2023-05-14 DIAGNOSIS — R5383 Other fatigue: Secondary | ICD-10-CM | POA: Diagnosis not present

## 2023-05-14 DIAGNOSIS — R030 Elevated blood-pressure reading, without diagnosis of hypertension: Secondary | ICD-10-CM | POA: Diagnosis not present

## 2023-05-14 DIAGNOSIS — R7303 Prediabetes: Secondary | ICD-10-CM | POA: Diagnosis not present

## 2023-05-14 DIAGNOSIS — E669 Obesity, unspecified: Secondary | ICD-10-CM | POA: Diagnosis not present

## 2023-07-14 DIAGNOSIS — R030 Elevated blood-pressure reading, without diagnosis of hypertension: Secondary | ICD-10-CM | POA: Diagnosis not present

## 2023-07-14 DIAGNOSIS — R7303 Prediabetes: Secondary | ICD-10-CM | POA: Diagnosis not present

## 2023-07-14 DIAGNOSIS — E7800 Pure hypercholesterolemia, unspecified: Secondary | ICD-10-CM | POA: Diagnosis not present

## 2023-07-14 DIAGNOSIS — E559 Vitamin D deficiency, unspecified: Secondary | ICD-10-CM | POA: Diagnosis not present

## 2023-07-14 DIAGNOSIS — N1832 Chronic kidney disease, stage 3b: Secondary | ICD-10-CM | POA: Diagnosis not present

## 2023-07-14 DIAGNOSIS — R7309 Other abnormal glucose: Secondary | ICD-10-CM | POA: Diagnosis not present

## 2023-07-14 DIAGNOSIS — E669 Obesity, unspecified: Secondary | ICD-10-CM | POA: Diagnosis not present

## 2023-08-10 DIAGNOSIS — H353132 Nonexudative age-related macular degeneration, bilateral, intermediate dry stage: Secondary | ICD-10-CM | POA: Diagnosis not present

## 2023-08-10 DIAGNOSIS — H2513 Age-related nuclear cataract, bilateral: Secondary | ICD-10-CM | POA: Diagnosis not present

## 2023-08-10 DIAGNOSIS — H11002 Unspecified pterygium of left eye: Secondary | ICD-10-CM | POA: Diagnosis not present

## 2023-08-11 DIAGNOSIS — R809 Proteinuria, unspecified: Secondary | ICD-10-CM | POA: Diagnosis not present

## 2023-08-11 DIAGNOSIS — I10 Essential (primary) hypertension: Secondary | ICD-10-CM | POA: Diagnosis not present

## 2023-08-11 DIAGNOSIS — D6489 Other specified anemias: Secondary | ICD-10-CM | POA: Diagnosis not present

## 2023-08-11 DIAGNOSIS — E785 Hyperlipidemia, unspecified: Secondary | ICD-10-CM | POA: Diagnosis not present

## 2023-08-11 DIAGNOSIS — N183 Chronic kidney disease, stage 3 unspecified: Secondary | ICD-10-CM | POA: Diagnosis not present

## 2023-08-11 DIAGNOSIS — E211 Secondary hyperparathyroidism, not elsewhere classified: Secondary | ICD-10-CM | POA: Diagnosis not present

## 2023-08-11 DIAGNOSIS — M109 Gout, unspecified: Secondary | ICD-10-CM | POA: Diagnosis not present

## 2023-08-11 DIAGNOSIS — R309 Painful micturition, unspecified: Secondary | ICD-10-CM | POA: Diagnosis not present

## 2023-08-11 DIAGNOSIS — N189 Chronic kidney disease, unspecified: Secondary | ICD-10-CM | POA: Diagnosis not present

## 2023-08-11 DIAGNOSIS — E669 Obesity, unspecified: Secondary | ICD-10-CM | POA: Diagnosis not present

## 2023-08-11 DIAGNOSIS — E559 Vitamin D deficiency, unspecified: Secondary | ICD-10-CM | POA: Diagnosis not present

## 2023-08-14 DIAGNOSIS — E669 Obesity, unspecified: Secondary | ICD-10-CM | POA: Diagnosis not present

## 2023-08-14 DIAGNOSIS — Z79899 Other long term (current) drug therapy: Secondary | ICD-10-CM | POA: Diagnosis not present

## 2023-08-14 DIAGNOSIS — E7800 Pure hypercholesterolemia, unspecified: Secondary | ICD-10-CM | POA: Diagnosis not present

## 2023-08-14 DIAGNOSIS — R5383 Other fatigue: Secondary | ICD-10-CM | POA: Diagnosis not present

## 2023-08-14 DIAGNOSIS — R7309 Other abnormal glucose: Secondary | ICD-10-CM | POA: Diagnosis not present

## 2023-08-14 DIAGNOSIS — E559 Vitamin D deficiency, unspecified: Secondary | ICD-10-CM | POA: Diagnosis not present

## 2023-08-14 DIAGNOSIS — R030 Elevated blood-pressure reading, without diagnosis of hypertension: Secondary | ICD-10-CM | POA: Diagnosis not present

## 2023-08-14 DIAGNOSIS — N1832 Chronic kidney disease, stage 3b: Secondary | ICD-10-CM | POA: Diagnosis not present

## 2023-08-14 DIAGNOSIS — R7303 Prediabetes: Secondary | ICD-10-CM | POA: Diagnosis not present

## 2023-08-14 DIAGNOSIS — Z131 Encounter for screening for diabetes mellitus: Secondary | ICD-10-CM | POA: Diagnosis not present

## 2023-09-14 DIAGNOSIS — R7303 Prediabetes: Secondary | ICD-10-CM | POA: Diagnosis not present

## 2023-09-14 DIAGNOSIS — R030 Elevated blood-pressure reading, without diagnosis of hypertension: Secondary | ICD-10-CM | POA: Diagnosis not present

## 2023-09-14 DIAGNOSIS — E669 Obesity, unspecified: Secondary | ICD-10-CM | POA: Diagnosis not present

## 2023-09-14 DIAGNOSIS — E559 Vitamin D deficiency, unspecified: Secondary | ICD-10-CM | POA: Diagnosis not present

## 2023-09-14 DIAGNOSIS — N1832 Chronic kidney disease, stage 3b: Secondary | ICD-10-CM | POA: Diagnosis not present

## 2023-09-14 DIAGNOSIS — R7309 Other abnormal glucose: Secondary | ICD-10-CM | POA: Diagnosis not present

## 2023-10-14 DIAGNOSIS — R7303 Prediabetes: Secondary | ICD-10-CM | POA: Diagnosis not present

## 2023-10-14 DIAGNOSIS — N1832 Chronic kidney disease, stage 3b: Secondary | ICD-10-CM | POA: Diagnosis not present

## 2023-10-14 DIAGNOSIS — E559 Vitamin D deficiency, unspecified: Secondary | ICD-10-CM | POA: Diagnosis not present

## 2023-10-14 DIAGNOSIS — R030 Elevated blood-pressure reading, without diagnosis of hypertension: Secondary | ICD-10-CM | POA: Diagnosis not present

## 2023-10-14 DIAGNOSIS — R7309 Other abnormal glucose: Secondary | ICD-10-CM | POA: Diagnosis not present

## 2023-10-14 DIAGNOSIS — E669 Obesity, unspecified: Secondary | ICD-10-CM | POA: Diagnosis not present

## 2023-11-12 DIAGNOSIS — E559 Vitamin D deficiency, unspecified: Secondary | ICD-10-CM | POA: Diagnosis not present

## 2023-11-12 DIAGNOSIS — R7309 Other abnormal glucose: Secondary | ICD-10-CM | POA: Diagnosis not present

## 2023-11-12 DIAGNOSIS — Z6829 Body mass index (BMI) 29.0-29.9, adult: Secondary | ICD-10-CM | POA: Diagnosis not present

## 2023-11-12 DIAGNOSIS — E669 Obesity, unspecified: Secondary | ICD-10-CM | POA: Diagnosis not present

## 2023-11-12 DIAGNOSIS — R7303 Prediabetes: Secondary | ICD-10-CM | POA: Diagnosis not present

## 2023-11-12 DIAGNOSIS — R030 Elevated blood-pressure reading, without diagnosis of hypertension: Secondary | ICD-10-CM | POA: Diagnosis not present

## 2023-11-12 DIAGNOSIS — N1832 Chronic kidney disease, stage 3b: Secondary | ICD-10-CM | POA: Diagnosis not present

## 2023-11-25 DIAGNOSIS — W19XXXA Unspecified fall, initial encounter: Secondary | ICD-10-CM | POA: Diagnosis not present

## 2023-11-25 DIAGNOSIS — M25532 Pain in left wrist: Secondary | ICD-10-CM | POA: Diagnosis not present

## 2023-11-27 DIAGNOSIS — Z1211 Encounter for screening for malignant neoplasm of colon: Secondary | ICD-10-CM | POA: Diagnosis not present

## 2023-11-27 DIAGNOSIS — K648 Other hemorrhoids: Secondary | ICD-10-CM | POA: Diagnosis not present

## 2023-12-06 DIAGNOSIS — M542 Cervicalgia: Secondary | ICD-10-CM | POA: Diagnosis not present

## 2023-12-22 DIAGNOSIS — E559 Vitamin D deficiency, unspecified: Secondary | ICD-10-CM | POA: Diagnosis not present

## 2023-12-22 DIAGNOSIS — Z6830 Body mass index (BMI) 30.0-30.9, adult: Secondary | ICD-10-CM | POA: Diagnosis not present

## 2023-12-22 DIAGNOSIS — E669 Obesity, unspecified: Secondary | ICD-10-CM | POA: Diagnosis not present

## 2023-12-22 DIAGNOSIS — E6609 Other obesity due to excess calories: Secondary | ICD-10-CM | POA: Diagnosis not present

## 2024-01-04 ENCOUNTER — Encounter: Payer: Self-pay | Admitting: Physical Medicine and Rehabilitation

## 2024-01-15 ENCOUNTER — Other Ambulatory Visit: Payer: Self-pay | Admitting: Family Medicine

## 2024-01-15 DIAGNOSIS — Z1231 Encounter for screening mammogram for malignant neoplasm of breast: Secondary | ICD-10-CM

## 2024-01-15 DIAGNOSIS — E2839 Other primary ovarian failure: Secondary | ICD-10-CM

## 2024-02-16 ENCOUNTER — Encounter: Payer: HMO | Attending: Physical Medicine and Rehabilitation | Admitting: Physical Medicine and Rehabilitation

## 2024-02-16 ENCOUNTER — Encounter: Payer: Self-pay | Admitting: Physical Medicine and Rehabilitation

## 2024-02-16 VITALS — BP 113/67 | HR 71 | Ht 61.0 in | Wt 147.8 lb

## 2024-02-16 DIAGNOSIS — G8929 Other chronic pain: Secondary | ICD-10-CM | POA: Insufficient documentation

## 2024-02-16 DIAGNOSIS — M7918 Myalgia, other site: Secondary | ICD-10-CM | POA: Diagnosis not present

## 2024-02-16 DIAGNOSIS — M549 Dorsalgia, unspecified: Secondary | ICD-10-CM | POA: Insufficient documentation

## 2024-02-16 DIAGNOSIS — Z636 Dependent relative needing care at home: Secondary | ICD-10-CM | POA: Diagnosis not present

## 2024-02-16 DIAGNOSIS — M542 Cervicalgia: Secondary | ICD-10-CM | POA: Insufficient documentation

## 2024-02-16 NOTE — Progress Notes (Signed)
 Subjective:    Patient ID: Briana Wolf, female    DOB: 02/16/1951, 73 y.o.   MRN: [id number]  HPI Mrs. Briana Wolf is a 73 year old woman who presents to establish care for chronic neck pain.  1) Chronic neck pain -she had a very tight spasm on right side of neck -range of motion is limited   2) Chronic back pain: -failed interventions: tramadol, topamax, gabapentin, surgery, trigger point injections, medial branch blocks, steroid injections -she has had multiple spinal surgeries -her spinal surgeon retired -she used to get steroid injections when she had pain radiating down legs- these provided 7-8 months of relief  3) History of being a caregiver: -discussed that she had to care for her husband when he had a dementia and that eventually he needed to be enrolled in a nursing home  Pain Inventory Average Pain 7 Pain Right Now 3 My pain is intermittent, constant, sharp, burning, dull, stabbing, tingling, and aching  In the last 24 hours, has pain interfered with the following? General activity 5 Relation with others 8 Enjoyment of life 5 What TIME of day is your pain at its worst? varies Sleep (in general) Fair  Pain is worse with: walking, bending, standing, and some activites Pain improves with: rest, heat/ice, medication, and injections Relief from Meds: 5  walk without assistance ability to climb steps?  yes do you drive?  yes  employed # of hrs/week 32-35 what is your job? [employer] and ret from [employer]  bladder control problems weakness dizziness depression anxiety  Any changes since last visit?  no  Any changes since last visit?  no    Family History  Problem Relation Age of Onset   Diabetes Mother    Heart disease Father    Social History   Socioeconomic History   Marital status: Married    Spouse name: Not on file   Number of children: Not on file   Years of education: Not on file   Highest education level: Not on file   Occupational History   Not on file  Tobacco Use   Smoking status: Never   Smokeless tobacco: Never  Substance and Sexual Activity   Alcohol use: Yes    Comment: wine rarely   Drug use: No   Sexual activity: Not on file  Other Topics Concern   Not on file  Social History Narrative   Not on file   Social Drivers of Health   Financial Resource Strain: Not on file  Food Insecurity: Not on file  Transportation Needs: Not on file  Physical Activity: Not on file  Stress: Not on file  Social Connections: Not on file   Past Surgical History:  Procedure Laterality Date   BACK SURGERY  2008   cervical fusion    BACK SURGERY  1998   lumb disk   CARPAL TUNNEL RELEASE  1987   left and right wrist    COLONOSCOPY     DILATION AND CURETTAGE OF UTERUS  2006   ELBOW ARTHROSCOPY     ulnar nerve-rt   FOOT SURGERY  2005   bilateral    LUMBAR LAMINECTOMY/DECOMPRESSION MICRODISCECTOMY Left 06/06/2014   Procedure: LUMBAR LAMINECTOMY/DECOMPRESSION MICRODISCECTOMY 1 LEVEL LEFT LUMBAR THREE-FOUR;  Surgeon: Karn Cassis, MD;  Location: [facility];  Service: Neurosurgery;  Laterality: Left;   ROTATOR CUFF REPAIR  1997   let shoulder    SHOULDER ARTHROSCOPY WITH ROTATOR CUFF REPAIR Right 01/20/2014   Procedure: RIGHT  SHOULDER ARTHROSCOPY WITH EXTENSIVE  DEBRIDEMENT, REVISION  ROTATOR CUFF REPAIR AND BICEPS TENOTOMY;  Surgeon: Eulas Post, MD;  Location: [facility];  Service: Orthopedics;  Laterality: Right;   SHOULDER ARTHROSCOPY WITH ROTATOR CUFF REPAIR AND SUBACROMIAL DECOMPRESSION Right 08/05/2013   Procedure: RIGHT SHOULDER ARTHROSCOPY WITH EXTENSIVE DEBRIDEMENT, ROTATOR CUFF REPAIR AND SUBACROMIAL DECOMPRESSION;  Surgeon: Eulas Post, MD;  Location: [facility];  Service: Orthopedics;  Laterality: Right;   TONSILLECTOMY     TUBAL LIGATION  1989   Past Medical History:  Diagnosis Date   Arthritis    Contact lens/glasses fitting    wears contacts or  glasses at times   Coughing    gets cough from dry throat   Depression    Family history of anesthesia complication    mom allergic-to many meds-multiple medical problems   GERD (gastroesophageal reflux disease)    Hyperlipidemia    Hypertension    Renal insufficiency    Dr Louis Meckel in [facility]   Tear of right rotator cuff 08/05/2013   BP 113/67   Pulse 71   Ht 5\' 1"  (1.549 m)   Wt 147 lb 12.8 oz (67 kg)   SpO2 95%   BMI 27.93 kg/m   Opioid Risk Score:   Fall Risk Score:  `1  Depression screen [facility] 2/9     02/16/2024   11:59 AM  Depression screen [facility] 2/9  Decreased Interest 2  Down, Depressed, Hopeless 1  PHQ - 2 Score 3  Altered sleeping 2  Tired, decreased energy 2  Change in appetite 0  Feeling bad or failure about yourself  1  Trouble concentrating 0  Moving slowly or fidgety/restless 0  Suicidal thoughts 0  PHQ-9 Score 8     Review of Systems  Musculoskeletal:  Positive for back pain and neck pain.  Psychiatric/Behavioral:  Positive for dysphoric mood. The patient is nervous/anxious.   All other systems reviewed and are negative.      Objective:   Physical Exam Gen: no distress, normal appearing HEENT: oral mucosa pink and moist, NCAT Cardio: Reg rate Chest: normal effort, normal rate of breathing Abd: soft, non-distended Ext: no edema Psych: pleasant, normal affect Skin: intact Neuro: Alert and oriented x3 MSK: tight muscles of neck      Assessment & Plan:   1) Chronic Pain Syndrome secondary to chronic cervical myofascial pain -discussed that she has a history of neck fusion -discussed that she has tried topamax/topiramate -prescribed PT -discussed that she has had trigger point injections in the past, will plan to repeat these next visit.  -Discussed current symptoms of pain and history of pain.  -Discussed benefits of exercise in reducing pain. -Discussed following foods that may reduce pain: 1) Ginger (especially studied for  arthritis)- reduce leukotriene production to decrease inflammation 2) Blueberries- high in phytonutrients that decrease inflammation 3) Salmon- marine omega-3s reduce joint swelling and pain 4) Pumpkin seeds- reduce inflammation 5) dark chocolate- reduces inflammation 6) turmeric- reduces inflammation 7) tart cherries - reduce pain and stiffness 8) extra virgin olive oil - its compound olecanthal helps to block prostaglandins  9) chili peppers- can be eaten or applied topically via capsaicin 10) mint- helpful for headache, muscle aches, joint pain, and itching 11) garlic- reduces inflammation  Link to further information on diet for chronic pain: http://www.bray.com/   2) Chronic low back pain: -discussed that tramadol helps, discussed that her PCP is prescribing for her currently.  -Discussed Qutenza as  an option for neuropathic pain control. Discussed that this is a capsaicin patch, stronger than capsaicin cream. Discussed that it is currently approved for diabetic peripheral neuropathy and post-herpetic neuralgia, but that it has also shown benefit in treating other forms of neuropathy. Provided patient with link to site to learn more about the patch: https://www.clark.biz/. Discussed that the patch would be placed in office and benefits usually last 3 months. Discussed that unintended exposure to capsaicin can cause severe irritation of eyes, mucous membranes, respiratory tract, and skin, but that Qutenza is a local treatment and does not have the systemic side effects of other nerve medications. Discussed that there may be pain, itching, erythema, and decreased sensory function associated with the application of Qutenza. Side effects usually subside within 1 week. A cold pack of analgesic medications can help with these side effects. Blood pressure can also be increased due to pain associated with administration of the  patch.   3) Caregiver burnout: -discussed her history of caring for her husband when he had dementia   60 minutes spent in chart review, discussing the history of her chronic neck and low back pain, discussed the medications she has had benefit from and that she has failed, discussed Qutenza and she is interested in trying, discussed that she does not want medications that make her sleepy during the day, discussed that she sleeps poorly due to her pain, reviewed vitals, discussed her response to trigger point injections, medial branch blocks, and steroid injections, discussed that she used to work as a Futures trader and she had married a Biochemist, clinical in the police department and has two children, provided list of anti-inflammatory foods, discussed that tramadol provides her relief and that her PCP prescribes this for her, discussed that she is interested in trying trigger point injections and therapy for myofascial release, discussed that she was the caretaker for her husband when he had dementia

## 2024-02-16 NOTE — Patient Instructions (Signed)
-  Discussed current symptoms of pain and history of pain.  -Discussed benefits of exercise in reducing pain. -Discussed following foods that may reduce pain: 1) Ginger (especially studied for arthritis)- reduce leukotriene production to decrease inflammation 2) Blueberries- high in phytonutrients that decrease inflammation 3) Salmon- marine omega-3s reduce joint swelling and pain 4) Pumpkin seeds- reduce inflammation 5) dark chocolate- reduces inflammation 6) turmeric- reduces inflammation 7) tart cherries - reduce pain and stiffness 8) extra virgin olive oil - its compound olecanthal helps to block prostaglandins  9) chili peppers- can be eaten or applied topically via capsaicin 10) mint- helpful for headache, muscle aches, joint pain, and itching 11) garlic- reduces inflammation  Link to further information on diet for chronic pain: http://www.randall.com/

## 2024-02-22 DIAGNOSIS — M129 Arthropathy, unspecified: Secondary | ICD-10-CM | POA: Diagnosis not present

## 2024-03-02 ENCOUNTER — Ambulatory Visit: Payer: HMO | Admitting: Physical Therapy

## 2024-03-24 DIAGNOSIS — Z6828 Body mass index (BMI) 28.0-28.9, adult: Secondary | ICD-10-CM | POA: Diagnosis not present

## 2024-03-24 DIAGNOSIS — E7800 Pure hypercholesterolemia, unspecified: Secondary | ICD-10-CM | POA: Diagnosis not present

## 2024-03-24 DIAGNOSIS — E559 Vitamin D deficiency, unspecified: Secondary | ICD-10-CM | POA: Diagnosis not present

## 2024-03-24 DIAGNOSIS — E663 Overweight: Secondary | ICD-10-CM | POA: Diagnosis not present

## 2024-03-30 NOTE — Progress Notes (Unsigned)

## 2024-03-31 ENCOUNTER — Encounter: Payer: HMO | Admitting: Physical Medicine and Rehabilitation

## 2024-03-31 DIAGNOSIS — M7918 Myalgia, other site: Secondary | ICD-10-CM

## 2024-04-01 ENCOUNTER — Encounter: Attending: Physical Medicine and Rehabilitation | Admitting: Physical Medicine and Rehabilitation

## 2024-04-01 VITALS — BP 86/53 | HR 80 | Ht 61.0 in | Wt 149.0 lb

## 2024-04-01 DIAGNOSIS — M7918 Myalgia, other site: Secondary | ICD-10-CM | POA: Insufficient documentation

## 2024-04-01 MED ORDER — LIDOCAINE HCL 1 % IJ SOLN
5.0000 mL | Freq: Once | INTRAMUSCULAR | Status: AC
  Administered 2024-04-01: 5 mL via INTRADERMAL

## 2024-04-01 NOTE — Progress Notes (Signed)

## 2024-04-21 ENCOUNTER — Encounter: Payer: Self-pay | Admitting: Physical Medicine and Rehabilitation

## 2024-04-21 ENCOUNTER — Encounter: Payer: HMO | Attending: Physical Medicine and Rehabilitation | Admitting: Physical Medicine and Rehabilitation

## 2024-04-21 VITALS — BP 109/70 | HR 65 | Ht 61.0 in | Wt 152.4 lb

## 2024-04-21 DIAGNOSIS — M549 Dorsalgia, unspecified: Secondary | ICD-10-CM | POA: Insufficient documentation

## 2024-04-21 DIAGNOSIS — M7918 Myalgia, other site: Secondary | ICD-10-CM | POA: Insufficient documentation

## 2024-04-21 DIAGNOSIS — G8929 Other chronic pain: Secondary | ICD-10-CM | POA: Diagnosis not present

## 2024-04-21 MED ORDER — CAPSAICIN-CLEANSING GEL 8 % EX KIT
2.0000 | PACK | Freq: Once | CUTANEOUS | Status: AC
  Administered 2024-04-21: 2 via TOPICAL

## 2024-04-21 NOTE — Progress Notes (Signed)
-  Discussed Qutenza  as an option for neuropathic pain control. Discussed that this is a capsaicin  patch, stronger than capsaicin  cream. Discussed that it is currently approved for diabetic peripheral neuropathy and post-herpetic neuralgia, but that it has also shown benefit in treating other forms of neuropathy. Provided patient with link to site to learn more about the patch: [email or web address] .[email or web address]/. Discussed that the patch would be placed in office and benefits usually last 3 months. Discussed that unintended exposure to capsaicin  can cause severe irritation of eyes, mucous membranes, respiratory tract, and skin, but that Qutenza  is a local treatment and does not have the systemic side effects of other nerve medications. Discussed that there may be pain, itching, erythema, and decreased sensory function associated with the application of Qutenza . Side effects usually subside within 1 week. A cold pack of analgesic medications can help with these side effects. Blood pressure can also be increased due to pain associated with administration of the patch.   2 patches of Qutenza  (40981) was applied to the bilateral lumbar and cervical spine. Ice packs were applied during the procedure to ensure patient comfort. Blood pressure was monitored every 15 minutes. The patient tolerated the procedure well. Post-procedure instructions were given and follow-up has been scheduled.  Topical system measures 14cm x20cm (280cm for a total 1120units) were applied which will cause deeper penetration for destruction of the peripheral nerve using a chemical (Qutenza ) which infuses into the skin like an injection and heat technique (occlusive, compressive dressing cauing endothermic heat technique)

## 2024-04-28 DIAGNOSIS — Z6828 Body mass index (BMI) 28.0-28.9, adult: Secondary | ICD-10-CM | POA: Diagnosis not present

## 2024-04-28 DIAGNOSIS — E663 Overweight: Secondary | ICD-10-CM | POA: Diagnosis not present

## 2024-04-28 DIAGNOSIS — E7800 Pure hypercholesterolemia, unspecified: Secondary | ICD-10-CM | POA: Diagnosis not present

## 2024-04-28 DIAGNOSIS — E559 Vitamin D deficiency, unspecified: Secondary | ICD-10-CM | POA: Diagnosis not present

## 2024-04-30 DIAGNOSIS — W19XXXA Unspecified fall, initial encounter: Secondary | ICD-10-CM | POA: Diagnosis not present

## 2024-04-30 DIAGNOSIS — Z043 Encounter for examination and observation following other accident: Secondary | ICD-10-CM | POA: Diagnosis not present

## 2024-04-30 DIAGNOSIS — S0093XA Contusion of unspecified part of head, initial encounter: Secondary | ICD-10-CM | POA: Diagnosis not present

## 2024-04-30 DIAGNOSIS — S0990XA Unspecified injury of head, initial encounter: Secondary | ICD-10-CM | POA: Diagnosis not present

## 2024-04-30 DIAGNOSIS — S63502A Unspecified sprain of left wrist, initial encounter: Secondary | ICD-10-CM | POA: Diagnosis not present

## 2024-05-26 DIAGNOSIS — I10 Essential (primary) hypertension: Secondary | ICD-10-CM | POA: Diagnosis not present

## 2024-05-26 DIAGNOSIS — N1832 Chronic kidney disease, stage 3b: Secondary | ICD-10-CM | POA: Diagnosis not present

## 2024-05-26 DIAGNOSIS — N1831 Chronic kidney disease, stage 3a: Secondary | ICD-10-CM | POA: Diagnosis not present

## 2024-06-03 DIAGNOSIS — E559 Vitamin D deficiency, unspecified: Secondary | ICD-10-CM | POA: Diagnosis not present

## 2024-06-03 DIAGNOSIS — Z1231 Encounter for screening mammogram for malignant neoplasm of breast: Secondary | ICD-10-CM | POA: Diagnosis not present

## 2024-06-03 DIAGNOSIS — Z79899 Other long term (current) drug therapy: Secondary | ICD-10-CM | POA: Diagnosis not present

## 2024-06-03 DIAGNOSIS — Z780 Asymptomatic menopausal state: Secondary | ICD-10-CM | POA: Diagnosis not present

## 2024-06-03 DIAGNOSIS — E663 Overweight: Secondary | ICD-10-CM | POA: Diagnosis not present

## 2024-06-03 DIAGNOSIS — Z6828 Body mass index (BMI) 28.0-28.9, adult: Secondary | ICD-10-CM | POA: Diagnosis not present

## 2024-06-03 DIAGNOSIS — E7800 Pure hypercholesterolemia, unspecified: Secondary | ICD-10-CM | POA: Diagnosis not present

## 2024-06-03 DIAGNOSIS — M109 Gout, unspecified: Secondary | ICD-10-CM | POA: Diagnosis not present

## 2024-06-03 DIAGNOSIS — R0602 Shortness of breath: Secondary | ICD-10-CM | POA: Diagnosis not present

## 2024-07-21 DIAGNOSIS — N1832 Chronic kidney disease, stage 3b: Secondary | ICD-10-CM | POA: Diagnosis not present

## 2024-07-21 DIAGNOSIS — F338 Other recurrent depressive disorders: Secondary | ICD-10-CM | POA: Diagnosis not present

## 2024-07-21 DIAGNOSIS — N1831 Chronic kidney disease, stage 3a: Secondary | ICD-10-CM | POA: Diagnosis not present

## 2024-07-21 DIAGNOSIS — E785 Hyperlipidemia, unspecified: Secondary | ICD-10-CM | POA: Diagnosis not present

## 2024-07-21 DIAGNOSIS — I10 Essential (primary) hypertension: Secondary | ICD-10-CM | POA: Diagnosis not present

## 2024-07-25 ENCOUNTER — Ambulatory Visit: Admitting: Physical Medicine and Rehabilitation

## 2024-08-11 DIAGNOSIS — N1831 Chronic kidney disease, stage 3a: Secondary | ICD-10-CM | POA: Diagnosis not present

## 2024-08-11 DIAGNOSIS — I10 Essential (primary) hypertension: Secondary | ICD-10-CM | POA: Diagnosis not present

## 2024-08-11 DIAGNOSIS — N1832 Chronic kidney disease, stage 3b: Secondary | ICD-10-CM | POA: Diagnosis not present

## 2024-08-21 DIAGNOSIS — I10 Essential (primary) hypertension: Secondary | ICD-10-CM | POA: Diagnosis not present

## 2024-08-21 DIAGNOSIS — N1832 Chronic kidney disease, stage 3b: Secondary | ICD-10-CM | POA: Diagnosis not present

## 2024-08-21 DIAGNOSIS — E785 Hyperlipidemia, unspecified: Secondary | ICD-10-CM | POA: Diagnosis not present

## 2024-08-21 DIAGNOSIS — F338 Other recurrent depressive disorders: Secondary | ICD-10-CM | POA: Diagnosis not present

## 2024-09-02 DIAGNOSIS — R7303 Prediabetes: Secondary | ICD-10-CM | POA: Diagnosis not present

## 2024-09-02 DIAGNOSIS — E559 Vitamin D deficiency, unspecified: Secondary | ICD-10-CM | POA: Diagnosis not present

## 2024-09-02 DIAGNOSIS — R635 Abnormal weight gain: Secondary | ICD-10-CM | POA: Diagnosis not present

## 2024-09-02 DIAGNOSIS — N1832 Chronic kidney disease, stage 3b: Secondary | ICD-10-CM | POA: Diagnosis not present

## 2024-09-02 DIAGNOSIS — M109 Gout, unspecified: Secondary | ICD-10-CM | POA: Diagnosis not present

## 2024-09-05 ENCOUNTER — Ambulatory Visit
Admission: RE | Admit: 2024-09-05 | Discharge: 2024-09-05 | Disposition: A | Discharge status: Home / Self Care | Payer: PPO | Source: Ambulatory Visit | Attending: Family Medicine | Admitting: Family Medicine

## 2024-09-05 ENCOUNTER — Other Ambulatory Visit: Payer: PPO

## 2024-09-05 DIAGNOSIS — Z1231 Encounter for screening mammogram for malignant neoplasm of breast: Secondary | ICD-10-CM

## 2024-09-07 DIAGNOSIS — M48062 Spinal stenosis, lumbar region with neurogenic claudication: Secondary | ICD-10-CM | POA: Diagnosis not present

## 2024-09-14 DIAGNOSIS — H35033 Hypertensive retinopathy, bilateral: Secondary | ICD-10-CM | POA: Diagnosis not present

## 2024-09-14 DIAGNOSIS — H353132 Nonexudative age-related macular degeneration, bilateral, intermediate dry stage: Secondary | ICD-10-CM | POA: Diagnosis not present

## 2024-09-14 DIAGNOSIS — H2513 Age-related nuclear cataract, bilateral: Secondary | ICD-10-CM | POA: Diagnosis not present

## 2024-09-14 DIAGNOSIS — I10 Essential (primary) hypertension: Secondary | ICD-10-CM | POA: Diagnosis not present

## 2024-09-20 DIAGNOSIS — M4726 Other spondylosis with radiculopathy, lumbar region: Secondary | ICD-10-CM | POA: Diagnosis not present

## 2024-09-20 DIAGNOSIS — I10 Essential (primary) hypertension: Secondary | ICD-10-CM | POA: Diagnosis not present

## 2024-09-20 DIAGNOSIS — M4727 Other spondylosis with radiculopathy, lumbosacral region: Secondary | ICD-10-CM | POA: Diagnosis not present

## 2024-09-20 DIAGNOSIS — M48062 Spinal stenosis, lumbar region with neurogenic claudication: Secondary | ICD-10-CM | POA: Diagnosis not present

## 2024-09-20 DIAGNOSIS — E785 Hyperlipidemia, unspecified: Secondary | ICD-10-CM | POA: Diagnosis not present

## 2024-09-20 DIAGNOSIS — N1831 Chronic kidney disease, stage 3a: Secondary | ICD-10-CM | POA: Diagnosis not present

## 2024-09-20 DIAGNOSIS — M48061 Spinal stenosis, lumbar region without neurogenic claudication: Secondary | ICD-10-CM | POA: Diagnosis not present

## 2024-09-20 DIAGNOSIS — N1832 Chronic kidney disease, stage 3b: Secondary | ICD-10-CM | POA: Diagnosis not present

## 2024-09-20 DIAGNOSIS — M5115 Intervertebral disc disorders with radiculopathy, thoracolumbar region: Secondary | ICD-10-CM | POA: Diagnosis not present

## 2024-09-20 DIAGNOSIS — F338 Other recurrent depressive disorders: Secondary | ICD-10-CM | POA: Diagnosis not present

## 2024-09-29 DIAGNOSIS — M47816 Spondylosis without myelopathy or radiculopathy, lumbar region: Secondary | ICD-10-CM | POA: Diagnosis not present

## 2024-09-29 DIAGNOSIS — M48062 Spinal stenosis, lumbar region with neurogenic claudication: Secondary | ICD-10-CM | POA: Diagnosis not present

## 2024-09-29 DIAGNOSIS — M5136 Other intervertebral disc degeneration, lumbar region with discogenic back pain only: Secondary | ICD-10-CM | POA: Diagnosis not present

## 2024-10-06 DIAGNOSIS — R635 Abnormal weight gain: Secondary | ICD-10-CM | POA: Diagnosis not present

## 2024-10-06 DIAGNOSIS — Z780 Asymptomatic menopausal state: Secondary | ICD-10-CM | POA: Diagnosis not present

## 2024-10-06 DIAGNOSIS — Z131 Encounter for screening for diabetes mellitus: Secondary | ICD-10-CM | POA: Diagnosis not present

## 2024-10-06 DIAGNOSIS — R5383 Other fatigue: Secondary | ICD-10-CM | POA: Diagnosis not present

## 2024-10-11 DIAGNOSIS — H903 Sensorineural hearing loss, bilateral: Secondary | ICD-10-CM | POA: Diagnosis not present

## 2024-10-21 DIAGNOSIS — N1832 Chronic kidney disease, stage 3b: Secondary | ICD-10-CM | POA: Diagnosis not present

## 2024-10-21 DIAGNOSIS — I10 Essential (primary) hypertension: Secondary | ICD-10-CM | POA: Diagnosis not present

## 2024-10-21 DIAGNOSIS — F338 Other recurrent depressive disorders: Secondary | ICD-10-CM | POA: Diagnosis not present

## 2024-10-21 DIAGNOSIS — E785 Hyperlipidemia, unspecified: Secondary | ICD-10-CM | POA: Diagnosis not present

## 2024-10-21 DIAGNOSIS — N1831 Chronic kidney disease, stage 3a: Secondary | ICD-10-CM | POA: Diagnosis not present

## 2024-10-27 DIAGNOSIS — N1832 Chronic kidney disease, stage 3b: Secondary | ICD-10-CM | POA: Diagnosis not present

## 2024-10-27 DIAGNOSIS — I10 Essential (primary) hypertension: Secondary | ICD-10-CM | POA: Diagnosis not present

## 2024-10-27 DIAGNOSIS — N1831 Chronic kidney disease, stage 3a: Secondary | ICD-10-CM | POA: Diagnosis not present

## 2024-11-08 DIAGNOSIS — M47816 Spondylosis without myelopathy or radiculopathy, lumbar region: Secondary | ICD-10-CM | POA: Diagnosis not present

## 2024-11-08 DIAGNOSIS — M5136 Other intervertebral disc degeneration, lumbar region with discogenic back pain only: Secondary | ICD-10-CM | POA: Diagnosis not present

## 2024-11-16 DIAGNOSIS — I10 Essential (primary) hypertension: Secondary | ICD-10-CM | POA: Diagnosis not present

## 2024-11-16 DIAGNOSIS — E785 Hyperlipidemia, unspecified: Secondary | ICD-10-CM | POA: Diagnosis not present

## 2024-11-16 DIAGNOSIS — N1831 Chronic kidney disease, stage 3a: Secondary | ICD-10-CM | POA: Diagnosis not present

## 2024-11-16 DIAGNOSIS — G894 Chronic pain syndrome: Secondary | ICD-10-CM | POA: Diagnosis not present

## 2024-11-16 DIAGNOSIS — F338 Other recurrent depressive disorders: Secondary | ICD-10-CM | POA: Diagnosis not present

## 2024-11-20 DIAGNOSIS — F338 Other recurrent depressive disorders: Secondary | ICD-10-CM | POA: Diagnosis not present

## 2024-11-20 DIAGNOSIS — N1832 Chronic kidney disease, stage 3b: Secondary | ICD-10-CM | POA: Diagnosis not present

## 2024-11-20 DIAGNOSIS — I10 Essential (primary) hypertension: Secondary | ICD-10-CM | POA: Diagnosis not present

## 2024-11-20 DIAGNOSIS — E785 Hyperlipidemia, unspecified: Secondary | ICD-10-CM | POA: Diagnosis not present

## 2024-11-20 DIAGNOSIS — N1831 Chronic kidney disease, stage 3a: Secondary | ICD-10-CM | POA: Diagnosis not present

## 2024-11-22 DIAGNOSIS — M47816 Spondylosis without myelopathy or radiculopathy, lumbar region: Secondary | ICD-10-CM | POA: Diagnosis not present

## 2024-12-01 DIAGNOSIS — M47816 Spondylosis without myelopathy or radiculopathy, lumbar region: Secondary | ICD-10-CM | POA: Diagnosis not present
